# Patient Record
Sex: Female | Born: 1995 | Hispanic: Yes | Marital: Single | Smoking: Never smoker
Health system: Southern US, Community
[De-identification: ages and names within clinical notes are randomized; demographics above are authoritative.]

## PROBLEM LIST (undated history)

## (undated) DIAGNOSIS — G809 Cerebral palsy, unspecified: Secondary | ICD-10-CM

## (undated) DIAGNOSIS — J45909 Unspecified asthma, uncomplicated: Secondary | ICD-10-CM

## (undated) DIAGNOSIS — G43909 Migraine, unspecified, not intractable, without status migrainosus: Secondary | ICD-10-CM

---
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
## 2016-03-27 ENCOUNTER — Ambulatory Visit (HOSPITAL_COMMUNITY)
Admission: EM | Admit: 2016-03-27 | Discharge: 2016-03-27 | Disposition: A | Discharge status: Home / Self Care | Payer: BLUE CROSS/BLUE SHIELD | Attending: Emergency Medicine | Admitting: Emergency Medicine

## 2016-03-27 ENCOUNTER — Encounter (HOSPITAL_COMMUNITY): Payer: Self-pay | Admitting: Emergency Medicine

## 2016-03-27 DIAGNOSIS — G44229 Chronic tension-type headache, not intractable: Secondary | ICD-10-CM

## 2016-03-27 HISTORY — DX: Unspecified asthma, uncomplicated: J45.909

## 2016-03-27 HISTORY — DX: Cerebral palsy, unspecified: G80.9

## 2016-03-27 MED ORDER — INDOMETHACIN 50 MG PO CAPS: 50.0000 mg | ORAL_CAPSULE | Freq: Two times a day (BID) | ORAL | 0 refills | Status: DC

## 2016-03-27 NOTE — Discharge Instructions (Signed)
In addition to the prescribed medicine called indomethacin you may also take acetaminophen every 4 hours and when you are able Benadryl 50 mg along with the Tylenol. The combination of these medicines may be more substantive for headache relief. If you develop additional symptoms such as we discussed or you are questioned about during the exam recommend seeing your PCP promptly or ointment to the emergency department. Read the attached instructions on different types of headache. He may gain some insight on cause and effect of medicine.

## 2016-03-27 NOTE — ED Provider Notes (Signed)
CSN: [id number]     Arrival date & time 03/27/16  1454 History   First MD Initiated Contact with Patient 03/27/16 1537     Chief Complaint  Patient presents with  . Headache    x9 days   (Consider location/radiation/quality/duration/timing/severity/associated sxs/prior Treatment) 21 year old obese female complaining of a headache for 9 days. It is left hemicranial. She states she has had headaches on a regular basis for several years generally every other day and often associated with photophobia. She states she was born with elements of cerebral palsy but as an adult she denies residual effects. She states this headache for 9 days is different in that it is consecutive and a little more intense than her previous headaches. Although she has a long history of frequent headaches she has not seen a physician in a long time. She generally takes ibuprofen for her headaches.  Denies problems with vision, speech, hearing, swallowing, focal paresthesias or weakness, problems with concentration, orientation, memory or mood, nausea, vomiting, abdominal pain. She does complain of photophobia when looking at a light.  Denies any sort of trauma, fall or other injury.       Past Medical History:  Diagnosis Date  . Asthma   . CP (cerebral palsy) (HCC)    History reviewed. No pertinent surgical history. History reviewed. No pertinent family history. Social History  Substance Use Topics  . Smoking status: Never Smoker  . Smokeless tobacco: Never Used  . Alcohol use No   OB History    No data available     Review of Systems  Constitutional: Negative for chills, diaphoresis and fever.  HENT: Negative for ear pain, hearing loss, nosebleeds, sore throat and tinnitus.   Eyes: Positive for photophobia. Negative for discharge and visual disturbance.       See also HPI  Respiratory: Negative for cough and shortness of breath.   Cardiovascular: Negative for chest pain, palpitations and leg swelling.   Gastrointestinal: Negative for abdominal pain and blood in stool.  Genitourinary: Negative.   Musculoskeletal: Negative.   Skin: Negative for rash.  Neurological: Positive for headaches. Negative for dizziness, tremors, seizures, syncope, facial asymmetry, speech difficulty, weakness and numbness.       See also HPI  Hematological: Negative.   Psychiatric/Behavioral: Negative.   All other systems reviewed and are negative.   Allergies  Patient has no known allergies.  Home Medications   Prior to Admission medications   Medication Sig Start Date End Date Taking? Authorizing Provider  indomethacin (INDOCIN) 50 MG capsule Take 1 capsule (50 mg total) by mouth 2 (two) times daily with a meal. Prn headache 03/27/16   Hayden Rasmussen, NP  topiramate (TOPAMAX) 100 MG tablet Take 100 mg by mouth 2 (two) times daily.    Historical Provider, MD   Meds Ordered and Administered this Visit  Medications - No data to display  BP 113/55 (BP Location: Right Arm)   Pulse 72   Temp 98 F (36.7 C) (Oral)   LMP 03/14/2016 (Exact Date)   SpO2 98%  No data found.   Physical Exam  Constitutional: She is oriented to person, place, and time. She appears well-developed and well-nourished. No distress.  HENT:  Head: Normocephalic and atraumatic.  Right Ear: External ear normal.  Left Ear: External ear normal.  Mouth/Throat: Oropharynx is clear and moist. No oropharyngeal exudate.  Positive for tenderness to the left hemicranial scalp. Mild tenderness to the associated posterior neck musculature and trapezii.  Eyes: Conjunctivae and  EOM are normal. Pupils are equal, round, and reactive to light. Right eye exhibits no discharge. Left eye exhibits no discharge.  Neck: Normal range of motion. Neck supple.  Cardiovascular: Normal rate, regular rhythm, normal heart sounds and intact distal pulses.   Pulmonary/Chest: Effort normal and breath sounds normal. No respiratory distress.  Abdominal: There is no  tenderness.  Musculoskeletal: Normal range of motion. She exhibits no edema or tenderness.  Lymphadenopathy:    She has no cervical adenopathy.  Neurological: She is alert and oriented to person, place, and time. She has normal strength. She is not disoriented. She displays no tremor. No cranial nerve deficit or sensory deficit. She exhibits normal muscle tone. She displays a negative Romberg sign. She displays no seizure activity. Coordination and gait normal. GCS eye subscore is 4. GCS verbal subscore is 5. GCS motor subscore is 6.  Reflex Scores:      Patellar reflexes are 2+ on the right side and 2+ on the left side. Finger to nose normal Heel to toe gait normal  Skin: Skin is warm and dry. No rash noted.  Psychiatric: She has a normal mood and affect. Her behavior is normal. Thought content normal.  Nursing note and vitals reviewed.   Urgent Care Course   Clinical Course     Procedures (including critical care time)  Labs Review Labs Reviewed - No data to display  Imaging Review No results found.   Visual Acuity Review  Right Eye Distance:   Left Eye Distance:   Bilateral Distance:    Right Eye Near:   Left Eye Near:    Bilateral Near:         MDM   1. Chronic tension-type headache, not intractable   Tension headache is only a possibility particularly associated with scalp and muscle tenderness. Analgesic rebound headache also may be contributory. Needs to follow-up with PCP or referred to neurology. Patient understands this and states she will comply. In addition to the prescribed medicine called indomethacin you may also take acetaminophen every 4 hours and when you are able Benadryl 50 mg along with the Tylenol. The combination of these medicines may be more substantive for headache relief. If you develop additional symptoms such as we discussed or you are questioned about during the exam recommend seeing your PCP promptly or ointment to the emergency  department. Read the attached instructions on different types of headache. He may gain some insight on cause and effect of medicine. Meds ordered this encounter  Medications  . indomethacin (INDOCIN) 50 MG capsule    Sig: Take 1 capsule (50 mg total) by mouth 2 (two) times daily with a meal. Prn headache    Dispense:  20 capsule    Refill:  0    Order Specific Question:   Supervising Provider    Answer:   Domenick GongMORTENSON, ASHLEY [[id number]]        Hayden Rasmussenavid Sister Carbone, NP 03/27/16 1603

## 2016-03-27 NOTE — ED Triage Notes (Signed)
Pt has been suffering from a headache for 9 days.  She states the throbbing gets worse every day. She has tried taking 800 mg Ibuprofen with little relief.  She has always suffered from headaches, but the last nine days has just been getting worse.

## 2016-09-29 ENCOUNTER — Ambulatory Visit (HOSPITAL_COMMUNITY)
Admission: EM | Admit: 2016-09-29 | Discharge: 2016-09-29 | Disposition: A | Discharge status: Home / Self Care | Payer: BLUE CROSS/BLUE SHIELD | Attending: Internal Medicine | Admitting: Internal Medicine

## 2016-09-29 ENCOUNTER — Encounter (HOSPITAL_COMMUNITY): Payer: Self-pay | Admitting: Emergency Medicine

## 2016-09-29 DIAGNOSIS — G43919 Migraine, unspecified, intractable, without status migrainosus: Secondary | ICD-10-CM | POA: Diagnosis not present

## 2016-09-29 MED ORDER — NAPROXEN 500 MG PO TABS
500.0000 mg | ORAL_TABLET | Freq: Two times a day (BID) | ORAL | 0 refills | Status: DC
Start: 2016-09-29 — End: 2017-04-22

## 2016-09-29 NOTE — ED Triage Notes (Signed)
Headache onset 2 weeks ago.  Reports pain everyday for 2 weeks.  Pain in left side of head, states this is terrible head pain.  Denies cough, cold or runny nose

## 2016-09-29 NOTE — ED Provider Notes (Signed)
CSN: [id number]     Arrival date & time 09/29/16  1542 History   None    Chief Complaint  Patient presents with  . Headache   (Consider location/radiation/quality/duration/timing/severity/associated sxs/prior Treatment) 21 year old female with history of cerebral palsy and chronic headache comes in with 2 week history of headache. She states she usually gets headaches with her cycle, last cycle 2 weeks ago when onset of headache. Cycle has now ended, but headache continues to persist.She has been taking ibuprofen with some relief. But has had worsening of symptoms the past few days. She describes the headache as pounding on the left side of her head, with photophobia. Denies phonophobia, nausea, vomiting. Denies some weakness, syncope, lightheadedness.  She has Topamax at home but has not tried it. Has not been evaluated for migraines in the past. Denies URI symptoms such as fever, congestion, cough.      Past Medical History:  Diagnosis Date  . Asthma   . CP (cerebral palsy) (HCC)    History reviewed. No pertinent surgical history. No family history on file. Social History  Substance Use Topics  . Smoking status: Never Smoker  . Smokeless tobacco: Never Used  . Alcohol use No   OB History    No data available     Review of Systems  Constitutional: Negative for chills, fatigue and fever.  HENT: Negative for congestion, facial swelling, sinus pain, sinus pressure and sore throat.   Respiratory: Negative for shortness of breath and wheezing.   Neurological: Positive for headaches. Negative for dizziness, syncope, weakness, light-headedness and numbness.    Allergies  Patient has no known allergies.  Home Medications   Prior to Admission medications   Medication Sig Start Date End Date Taking? Authorizing Provider  indomethacin (INDOCIN) 50 MG capsule Take 1 capsule (50 mg total) by mouth 2 (two) times daily with a meal. Prn headache 03/27/16   Hayden RasmussenMabe, David, NP  naproxen  (NAPROSYN) 500 MG tablet Take 1 tablet (500 mg total) by mouth 2 (two) times daily. 09/29/16   Cathie HoopsYu, Amy V, PA-C  topiramate (TOPAMAX) 100 MG tablet Take 100 mg by mouth 2 (two) times daily.    [provider]   Meds Ordered and Administered this Visit  Medications - No data to display  BP (!) 108/41 (BP Location: Right Arm) Comment (BP Location): large cuff  Pulse 78   Temp 98.5 F (36.9 C) (Oral)   Resp 18   LMP 09/17/2016   SpO2 100%  No data found.   Physical Exam  Constitutional: She is oriented to person, place, and time. She appears well-developed and well-nourished. No distress.  HENT:  Head: Normocephalic and atraumatic.  Eyes: Conjunctivae are normal. Pupils are equal, round, and reactive to light.  Neck: Normal range of motion.  Cardiovascular: Normal rate, regular rhythm and normal heart sounds.  Exam reveals no gallop and no friction rub.   No murmur heard. Pulmonary/Chest: Effort normal and breath sounds normal. She has no wheezes. She has no rales.  Neurological: She is alert and oriented to person, place, and time. She has normal strength. No cranial nerve deficit or sensory deficit. She displays a negative Romberg sign.  Skin: Skin is warm and dry.  Psychiatric: She has a normal mood and affect. Her behavior is normal. Judgment normal.    Urgent Care Course     Procedures (including critical care time)  Labs Review Labs Reviewed - No data to display  Imaging Review No results found.  MDM   1. Intractable migraine without status migrainosus, unspecified migraine type    Discussed with patient history and exam consistent with headache with migrainous features. Patient with normal neuro exam today. The patient declines IM Toradol and steroids. Start Naproxen 500 mg twice a day for headache. Patient's blood pressure on the lower side today. Denies orthostasis, weakness, dizziness. Patient to push fluids. Patient to follow up with PCP for migraine  workup and possible maintenance medicine. To monitor for nausea, vomiting, worsening of migraine, weakness to the ED for further evaluation.    Belinda Fisher, PA-C 09/29/16 1626

## 2016-09-29 NOTE — Discharge Instructions (Signed)
Start Naproxen 500mg  twice a day for headache. Your headache is consistent with a migraine, follow up with PCP for evaluation of migraine and possible maintenance medicines. Your blood pressure is a little low today, which could be contributing to your headache, keep hydrated, your urine should be a clear/pale yellow color. Monitor for nausea, vomiting, weakness, to go to the ED immediately.

## 2016-11-15 ENCOUNTER — Ambulatory Visit (HOSPITAL_COMMUNITY)
Admission: EM | Admit: 2016-11-15 | Discharge: 2016-11-15 | Disposition: A | Discharge status: Home / Self Care | Payer: BLUE CROSS/BLUE SHIELD | Attending: Family Medicine | Admitting: Family Medicine

## 2016-11-15 ENCOUNTER — Encounter (HOSPITAL_COMMUNITY): Payer: Self-pay | Admitting: Emergency Medicine

## 2016-11-15 DIAGNOSIS — H1032 Unspecified acute conjunctivitis, left eye: Secondary | ICD-10-CM

## 2016-11-15 MED ORDER — TOBRAMYCIN 0.3 % OP SOLN: 1.0000 [drp] | Freq: Four times a day (QID) | OPHTHALMIC | 0 refills | Status: DC

## 2016-11-15 NOTE — ED Provider Notes (Signed)
  [facility]   [id number] 11/15/16 Arrival Time: 1318  ASSESSMENT & PLAN:  1. Acute conjunctivitis of left eye, unspecified acute conjunctivitis type     Meds ordered this encounter  Medications  . tobramycin (TOBREX) 0.3 % ophthalmic solution    Sig: Place 1 drop into the right eye every 6 (six) hours.    Dispense:  5 mL    Refill:  0   Will f/u if not showing improvement within the next several days. Reviewed expectations re: course of current medical issues. Questions answered. Outlined signs and symptoms indicating need for more acute intervention. Patient verbalized understanding. After Visit Summary given.   SUBJECTIVE:  Tammy Randall is a 21 y.o. female who presents with complaint of "pinkeye" of L eye. Noticed yesterday. Gritty feeling in eye. Draining more today. No specific eye pain. No visual changes. No self treatment. Close contact with the same last week. No recent illnesses. Does not wear contact lenses.  ROS: As per HPI.   OBJECTIVE:  Vitals:   11/15/16 1426  BP: (!) 124/55  Pulse: 68  Resp: 20  Temp: 98.5 F (36.9 C)  TempSrc: Oral  SpO2: 100%    General appearance: alert; no distress Eyes: PERRLA; EOMI; conjunctiva normal HENT: normocephalic; atraumatic; TMs normal; nasal mucosa normal; oral mucosa normal Neck: supple Skin: warm and dry Psychological: alert and cooperative; normal mood and affect  Past Medical History:  Diagnosis Date  . Asthma   . CP (cerebral palsy) (HCC)     No Known Allergies       Mardella Layman, MD 11/15/16 1447

## 2016-11-15 NOTE — ED Triage Notes (Signed)
Pt here for left pink eye onset yest associated w/pain, redness, watery  Denies fevers, chills, inj/trauma  A&O x4... NAD... Ambulatory

## 2017-04-22 ENCOUNTER — Encounter (HOSPITAL_COMMUNITY): Payer: Self-pay | Admitting: Emergency Medicine

## 2017-04-22 ENCOUNTER — Ambulatory Visit (HOSPITAL_COMMUNITY)
Admission: EM | Admit: 2017-04-22 | Discharge: 2017-04-22 | Disposition: A | Discharge status: Home / Self Care | Payer: BLUE CROSS/BLUE SHIELD | Attending: Family Medicine | Admitting: Family Medicine

## 2017-04-22 ENCOUNTER — Other Ambulatory Visit: Payer: Self-pay

## 2017-04-22 DIAGNOSIS — Z87898 Personal history of other specified conditions: Secondary | ICD-10-CM | POA: Insufficient documentation

## 2017-04-22 DIAGNOSIS — J45909 Unspecified asthma, uncomplicated: Secondary | ICD-10-CM | POA: Diagnosis not present

## 2017-04-22 DIAGNOSIS — G809 Cerebral palsy, unspecified: Secondary | ICD-10-CM | POA: Insufficient documentation

## 2017-04-22 DIAGNOSIS — R590 Localized enlarged lymph nodes: Secondary | ICD-10-CM | POA: Diagnosis present

## 2017-04-22 DIAGNOSIS — Z8619 Personal history of other infectious and parasitic diseases: Secondary | ICD-10-CM

## 2017-04-22 LAB — POCT PREGNANCY, URINE: Preg Test, Ur: NEGATIVE

## 2017-04-22 MED ORDER — DOXYCYCLINE HYCLATE 100 MG PO TABS: 100.0000 mg | ORAL_TABLET | Freq: Two times a day (BID) | ORAL | 0 refills | Status: DC

## 2017-04-22 NOTE — ED Provider Notes (Signed)
[facility]   [id number] 04/22/17 Arrival Time: 1052   SUBJECTIVE:  Tammy Randall is a 22 y.o. female who presents to the urgent care with complaint of 2 days ago with sore neck, on left side of neck.  Patient has a large firm area on left neck.  No increase in size since noticing this knot, but area is more firm.  Denies cough, cold runny nose.  No injury  She was treated for gonorrhea last month.  She said she was tested for everything. Past Medical History:  Diagnosis Date  . Asthma   . CP (cerebral palsy) (HCC)    Family History  Problem Relation Age of Onset  . Diabetes Mother    Social History   Socioeconomic History  . Marital status: Unknown    Spouse name: Not on file  . Number of children: Not on file  . Years of education: Not on file  . Highest education level: Not on file  Social Needs  . Financial resource strain: Not on file  . Food insecurity - worry: Not on file  . Food insecurity - inability: Not on file  . Transportation needs - medical: Not on file  . Transportation needs - non-medical: Not on file  Occupational History  . Not on file  Tobacco Use  . Smoking status: Never Smoker  . Smokeless tobacco: Never Used  Substance and Sexual Activity  . Alcohol use: No  . Drug use: No  . Sexual activity: Yes    Birth control/protection: Condom  Other Topics Concern  . Not on file  Social History Narrative  . Not on file   No outpatient medications have been marked as taking for the 04/22/17 encounter [facility] Encounter).   No Known Allergies    ROS: As per HPI, remainder of ROS negative.   OBJECTIVE:   Vitals:   04/22/17 1201  BP: (!) 108/34  Pulse: 75  Resp: 18  Temp: 98.3 F (36.8 C)  TempSrc: Oral  SpO2: 100%     General appearance: alert; no distress Eyes: PERRL; EOMI; conjunctiva normal HENT: normocephalic; atraumatic; TMs normal, canal normal, external ears normal without trauma; nasal mucosa normal; oral mucosa  normal Neck: supple; mildly tender 2 cm elliptical subcutaneous mass left lower posterior neck Lungs: clear to auscultation bilaterally Heart: regular rate and rhythm Abdomen: soft, non-tender; bowel sounds normal; no masses or organomegaly; no guarding or rebound tenderness Back: no CVA tenderness Extremities: no cyanosis or edema; symmetrical with no gross deformities Skin: warm and dry Neurologic: normal gait; grossly normal Psychological: alert and cooperative; normal mood and affect      Labs:  Results for orders placed or performed during the hospital encounter of 04/22/17  Pregnancy, urine POC  Result Value Ref Range   Preg Test, Ur NEGATIVE NEGATIVE    Labs Reviewed  POCT PREGNANCY, URINE  URINE CYTOLOGY ANCILLARY ONLY    No results found.     ASSESSMENT & PLAN:  1. Lymphadenopathy, cervical   2. H/O gonorrhea   doxycycline 100 bid x 7 days  Usually, enlarged glands are related to an infection either inside or in the vicinity of the gland.    It often takes a couple weeks for the gland to return to normal.  If the swelling persists, please return.  The final result on the gonorrhea test will be ready in a day or so. Reviewed expectations re: course of current medical issues. Questions answered. Outlined signs and symptoms indicating need for  more acute intervention. Patient verbalized understanding. After Visit Summary given.    Procedures:      Elvina SidleLauenstein, Xavius Spadafore, MD 04/22/17 1302

## 2017-04-22 NOTE — Discharge Instructions (Signed)
Usually, enlarged glands are related to an infection either inside or in the vicinity of the gland.    It often takes a couple weeks for the gland to return to normal.  If the swelling persists, please return.  The final result on the gonorrhea test will be ready in a day or so.

## 2017-04-22 NOTE — ED Triage Notes (Signed)
Woke 2 days ago with sore neck, on left side of neck.  Patient has a large firm area to left neck.  No increase in size since noticing this knot, but area is more firm.  Denies cough, cold runny nose.  No injury

## 2017-04-23 LAB — URINE CYTOLOGY ANCILLARY ONLY
Chlamydia: NEGATIVE
Neisseria Gonorrhea: NEGATIVE

## 2017-07-07 ENCOUNTER — Ambulatory Visit (HOSPITAL_COMMUNITY)
Admission: EM | Admit: 2017-07-07 | Discharge: 2017-07-07 | Disposition: A | Discharge status: Home / Self Care | Payer: BLUE CROSS/BLUE SHIELD | Attending: Family Medicine | Admitting: Family Medicine

## 2017-07-07 ENCOUNTER — Encounter (HOSPITAL_COMMUNITY): Payer: Self-pay | Admitting: Emergency Medicine

## 2017-07-07 DIAGNOSIS — G43711 Chronic migraine without aura, intractable, with status migrainosus: Secondary | ICD-10-CM

## 2017-07-07 DIAGNOSIS — R51 Headache: Secondary | ICD-10-CM

## 2017-07-07 DIAGNOSIS — R519 Headache, unspecified: Secondary | ICD-10-CM

## 2017-07-07 NOTE — Discharge Instructions (Addendum)
May trial oral medication from PCP for headache (uncertain of name). [facility] to schedule appointment for further evaluation of chronic headaches. If headache persists, may return within 48 hours for Toradol injection. Recommend follow-up with headache specialist or Neurologist as planned.

## 2017-07-07 NOTE — ED Triage Notes (Signed)
Pt hx of chronic headaches since childhood, pt requesting a neuro follow up info, pt c/o headache this time since Monday.

## 2017-07-08 NOTE — ED Provider Notes (Signed)
[facility]    CSN: [id number] Arrival date & time: 07/07/17  1251     History   Chief Complaint Chief Complaint  Patient presents with  . Headache    HPI Tammy Randall is a 22 y.o. female.   22 year old female presents with chronic migraine-type headaches. She has experienced these headaches since childhood. 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 She usually has a headache on the left side that is accompanied by photophobia and some dizziness. Usually is not nauseous or vomit but sleep usually relieves the headache. She has tried various OTC medications and Rx NSAIDs in the past with varying success. This current headache has been more severe for the past 2 days and located on the left frontal area with radiation to the parietal and occipital areas. Denies any fever, blurred vision, numbness, syncope or other neurological deficits. No distinct aura. Did not take any medication yet- wanted to see how long headache would last without treatment. She has been to her PCP in the past for management but would like to see a Neurologist or Headache Specialist for further management. No family history of headache disorder. No other chronic health issues except asthma. Takes no daily medication.   The history is provided by the patient.  Headache  Pain location:  L parietal, L temporal and frontal Quality:  Dull and stabbing Radiates to:  Eyes and L neck Severity currently:  6/10 Severity at highest:  9/10 Onset quality:  Gradual Duration:  2 days Timing:  Intermittent Progression:  Unchanged Chronicity:  Chronic Similar to prior headaches: yes   Context: bright light and emotional stress   Context: not caffeine, not coughing, not eating, not exposure to cold air, not loud noise and not straining   Relieved by:  Resting in a darkened room Worsened  by:  Activity and light Ineffective treatments:  NSAIDs, acetaminophen and prescription medications Associated symptoms: dizziness, eye pain (headache radiates to her eyes), fatigue and photophobia   Associated symptoms: no abdominal pain, no back pain, no blurred vision, no congestion, no cough, no diarrhea, no drainage, no ear pain, no facial pain, no fever, no focal weakness, no hearing loss, no loss of balance, no myalgias, no nausea, no near-syncope, no neck pain, no neck stiffness, no numbness, no paresthesias, no seizures, no sinus pressure, no sore throat, no swollen glands, no syncope, no tingling, no URI, no visual change, no vomiting and no weakness     Past Medical History:  Diagnosis Date  . Asthma   . CP (cerebral palsy) (HCC)     There are no active problems to display for this patient.   History reviewed. No pertinent surgical history.  OB History   None      Home Medications    Prior to Admission medications   Not on File    Family History Family History  Problem Relation Age of Onset  . Diabetes Mother     Social History Social History   Tobacco Use  . Smoking status: Never Smoker  . Smokeless tobacco: Never Used  Substance Use Topics  . Alcohol use: No  . Drug use: No     Allergies   Patient has no known allergies.   Review of Systems Review of Systems  Constitutional: Positive for activity change and fatigue. Negative for appetite change, chills  and fever.  HENT: Negative for congestion, ear discharge, ear pain, facial swelling, hearing loss, postnasal drip, rhinorrhea, sinus pressure, sneezing and sore throat.   Eyes: Positive for photophobia and pain (headache radiates to her eyes). Negative for blurred vision, discharge, redness and visual disturbance.  Respiratory: Negative for cough, chest tightness, shortness of breath and wheezing.   Cardiovascular: Negative for chest pain, palpitations, syncope and near-syncope.  Gastrointestinal:  Negative for abdominal pain, diarrhea, nausea and vomiting.  Musculoskeletal: Negative for arthralgias, back pain, myalgias, neck pain and neck stiffness.  Skin: Negative for rash and wound.  Allergic/Immunologic: Negative for environmental allergies and immunocompromised state.  Neurological: Positive for dizziness, light-headedness and headaches. Negative for tremors, focal weakness, seizures, syncope, facial asymmetry, weakness, numbness, paresthesias and loss of balance.  Hematological: Negative for adenopathy. Does not bruise/bleed easily.     Physical Exam Triage Vital Signs ED Triage Vitals  Enc Vitals Group     BP 07/07/17 1336 (!) 127/93     Pulse Rate 07/07/17 1336 65     Resp 07/07/17 1336 16     Temp 07/07/17 1336 98.2 F (36.8 C)     Temp src --      SpO2 07/07/17 1336 98 %     Weight --      Height --      Head Circumference --      Peak Flow --      Pain Score 07/07/17 1337 6     Pain Loc --      Pain Edu? --      Excl. in GC? --    No data found.  Updated Vital Signs BP (!) 127/93   Pulse 65   Temp 98.2 F (36.8 C)   Resp 16   LMP 06/22/2017   SpO2 98%   Visual Acuity Right Eye Distance:   Left Eye Distance:   Bilateral Distance:    Right Eye Near:   Left Eye Near:    Bilateral Near:     Physical Exam  Constitutional: She is oriented to person, place, and time. She appears well-developed and well-nourished. She is cooperative. She does not appear ill. No distress.  Patient sitting on exam table in no acute distress.   HENT:  Head: Normocephalic and atraumatic.    Right Ear: Hearing, tympanic membrane, external ear and ear canal normal.  Left Ear: Hearing, tympanic membrane, external ear and ear canal normal.  Nose: Right sinus exhibits no maxillary sinus tenderness and no frontal sinus tenderness. Left sinus exhibits frontal sinus tenderness. Left sinus exhibits no maxillary sinus tenderness.  Mouth/Throat: Uvula is midline, oropharynx is  clear and moist and mucous membranes are normal.  Headache starts around left frontal area and radiates to left parietal, occipital and behind left eye.   Eyes: Pupils are equal, round, and reactive to light. Conjunctivae, EOM and lids are normal.  Neck: Trachea normal, normal range of motion and full passive range of motion without pain. Neck supple. No JVD present. No spinous process tenderness and no muscular tenderness present. Carotid bruit is not present. No neck rigidity. Normal range of motion present.  Cardiovascular: Normal rate, regular rhythm and normal heart sounds.  No murmur heard. Pulmonary/Chest: Effort normal and breath sounds normal. No stridor. No respiratory distress. She has no wheezes.  Musculoskeletal: Normal range of motion.  Neurological: She is alert and oriented to person, place, and time. She has normal strength and normal reflexes. No cranial nerve deficit or sensory deficit.  She displays a negative Romberg sign. Coordination and gait normal. GCS eye subscore is 4. GCS verbal subscore is 5. GCS motor subscore is 6.  Skin: Skin is warm and dry. Capillary refill takes less than 2 seconds. No rash noted.  Psychiatric: She has a normal mood and affect. Her behavior is normal. Judgment and thought content normal.  Vitals reviewed.    UC Treatments / Results  Labs (all labs ordered are listed, but only abnormal results are displayed) Labs Reviewed - No data to display  EKG None Radiology No results found.  Procedures Procedures (including critical care time)  Medications Ordered in UC Medications - No data to display   Initial Impression / Assessment and Plan / UC Course  I have reviewed the triage vital signs and the nursing notes.  Pertinent labs & imaging results that were available during my care of the patient were reviewed by me and considered in my medical decision making (see chart for details).    Discussed with patient that she probably has  chronic migraines with some tension-type headache intermixed. Reviewed that she most likely should be on preventative medication since she has multiple headaches each month in varying length. Also briefly discussed multiple options for treatment- may be candidate for Triptans. Offered Toradol IM today but patient declined. Also offered Rx NSAIDs but patient also declined. Recommend take Aleve 2 tablets every 12 hours as needed for pain. Patient has oral powder mix that was prescribed by her PCP for headaches (uncertain of name)- may also trial this medication. [facility] to schedule appointment for further evaluation. [facility], may also contact [facility]. Note written for work for today. Patient indicates that if headache has not improved within 48 hours, she will return for Toradol injection. Otherwise, follow-up with Headache Specialist or Neurologist as planned.   Final Clinical Impressions(s) / UC Diagnoses   Final diagnoses:  Chronic migraine without aura, intractable, with status migrainosus  Headache disorder    ED Discharge Orders    None       Controlled Substance Prescriptions [place] Controlled Substance Registry consulted? Not Applicable   Sudie Grumblingmyot, Aoife Bold Berry, NP 07/08/17 [phone]

## 2017-10-04 ENCOUNTER — Encounter (HOSPITAL_COMMUNITY): Payer: Self-pay | Admitting: Emergency Medicine

## 2017-10-04 ENCOUNTER — Emergency Department (HOSPITAL_COMMUNITY)
Admission: EM | Admit: 2017-10-04 | Discharge: 2017-10-05 | Disposition: A | Discharge status: Home / Self Care | Payer: BLUE CROSS/BLUE SHIELD | Attending: Emergency Medicine | Admitting: Emergency Medicine

## 2017-10-04 ENCOUNTER — Other Ambulatory Visit: Payer: Self-pay

## 2017-10-04 DIAGNOSIS — J45909 Unspecified asthma, uncomplicated: Secondary | ICD-10-CM | POA: Diagnosis not present

## 2017-10-04 DIAGNOSIS — R51 Headache: Secondary | ICD-10-CM | POA: Insufficient documentation

## 2017-10-04 DIAGNOSIS — Z79899 Other long term (current) drug therapy: Secondary | ICD-10-CM | POA: Insufficient documentation

## 2017-10-04 DIAGNOSIS — R519 Headache, unspecified: Secondary | ICD-10-CM

## 2017-10-04 LAB — CBC
HCT: 41 % (ref 36.0–46.0)
Hemoglobin: 13 g/dL (ref 12.0–15.0)
MCH: 27.5 pg (ref 26.0–34.0)
MCHC: 31.7 g/dL (ref 30.0–36.0)
MCV: 86.7 fL (ref 78.0–100.0)
Platelets: 156 10*3/uL (ref 150–400)
RBC: 4.73 MIL/uL (ref 3.87–5.11)
RDW: 13.2 % (ref 11.5–15.5)
WBC: 7.4 10*3/uL (ref 4.0–10.5)

## 2017-10-04 LAB — I-STAT BETA HCG BLOOD, ED (MC, WL, AP ONLY)

## 2017-10-04 LAB — BASIC METABOLIC PANEL
Anion gap: 8 (ref 5–15)
BUN: 5 mg/dL — AB (ref 6–20)
CALCIUM: 8.2 mg/dL — AB (ref 8.9–10.3)
CHLORIDE: 105 mmol/L (ref 98–111)
CO2: 24 mmol/L (ref 22–32)
CREATININE: 0.69 mg/dL (ref 0.44–1.00)
GFR calc Af Amer: >60 mL/min (ref >60)
GFR calc non Af Amer: >60 mL/min (ref >60)
Glucose, Bld: 92 mg/dL (ref 70–99)
Potassium: 3.3 mmol/L — ABNORMAL LOW (ref 3.5–5.1)
SODIUM: 137 mmol/L (ref 135–145)

## 2017-10-04 NOTE — ED Triage Notes (Signed)
Pt reports migraines "since birth." States current migraine has been bothering her since last week. Reports vision changes and decreased appetite.

## 2017-10-05 MED ORDER — METOCLOPRAMIDE HCL 5 MG/ML IJ SOLN
10.0000 mg | Freq: Once | INTRAMUSCULAR | Status: AC
  Administered 2017-10-05: 10 mg via INTRAVENOUS
  Filled 2017-10-05: qty 2

## 2017-10-05 MED ORDER — DIPHENHYDRAMINE HCL 50 MG/ML IJ SOLN
25.0000 mg | Freq: Once | INTRAMUSCULAR | Status: AC
  Administered 2017-10-05: 25 mg via INTRAVENOUS
  Filled 2017-10-05: qty 1

## 2017-10-05 MED ORDER — SODIUM CHLORIDE 0.9 % IV BOLUS
1000.0000 mL | Freq: Once | INTRAVENOUS | Status: AC
  Administered 2017-10-05: 1000 mL via INTRAVENOUS

## 2017-10-05 MED ORDER — KETOROLAC TROMETHAMINE 30 MG/ML IJ SOLN
30.0000 mg | Freq: Once | INTRAMUSCULAR | Status: AC
  Administered 2017-10-05: 30 mg via INTRAVENOUS
  Filled 2017-10-05: qty 1

## 2017-10-05 NOTE — ED Provider Notes (Signed)
[facility] EMERGENCY DEPARTMENT Provider Note   CSN: [id number] Arrival date & time: 10/04/17  2139     History   Chief Complaint Chief Complaint  Patient presents with  . Migraine    HPI Tammy Randall is a 22 y.o. female.  Patient presents to the emergency department with a chief complaint of migraine.  She reports a history of migraines.  She states that she has had them since she was a child.  She reports having had this migraine for the past week.  She has recently moved to the area, and does not have a neurologist.  Her last neurologist was in [place].  She denies numbness, weakness, or tingling.  Denies vision changes.  Denies any fever, chills, or neck stiffness.  Denies any recent illnesses.  She states that she has tried taking baclofen with no relief.  Additionally, patient asks if she can have allergy testing done in the emergency department.  The history is provided by the patient. No language interpreter was used.    Past Medical History:  Diagnosis Date  . Asthma   . CP (cerebral palsy) (HCC)     There are no active problems to display for this patient.   History reviewed. No pertinent surgical history.   OB History   None      Home Medications    Prior to Admission medications   Medication Sig Start Date End Date Taking? Authorizing Provider  acetaminophen (TYLENOL) 500 MG tablet Take 1,000 mg by mouth every 6 (six) hours as needed for headache.   Yes [provider]    Family History Family History  Problem Relation Age of Onset  . Diabetes Mother     Social History Social History   Tobacco Use  . Smoking status: Never Smoker  . Smokeless tobacco: Never Used  Substance Use Topics  . Alcohol use: No  . Drug use: No     Allergies   Patient has no known allergies.   Review of Systems Review of Systems  All other systems reviewed and are negative.    Physical Exam Updated Vital Signs BP 119/63  (BP Location: Right Arm)   Pulse (!) 112   Temp 99.4 F (37.4 C) (Oral)   Resp 16   Ht 5\' 2"  (1.575 m)   Wt 108.9 kg (240 lb)   LMP 08/22/2017 (Approximate)   SpO2 99%   BMI 43.90 kg/m   Physical Exam  Constitutional: She is oriented to person, place, and time. She appears well-developed and well-nourished.  HENT:  Head: Normocephalic and atraumatic.  Right Ear: External ear normal.  Left Ear: External ear normal.  Eyes: Pupils are equal, round, and reactive to light. Conjunctivae and EOM are normal.  Neck: Normal range of motion. Neck supple.  No pain with neck flexion, no meningismus  Cardiovascular: Normal rate, regular rhythm and normal heart sounds. Exam reveals no gallop and no friction rub.  No murmur heard. Pulmonary/Chest: Effort normal and breath sounds normal. No respiratory distress. She has no wheezes. She has no rales. She exhibits no tenderness.  Abdominal: Soft. She exhibits no distension and no mass. There is no tenderness. There is no rebound and no guarding.  Musculoskeletal: Normal range of motion. She exhibits no edema or tenderness.  Normal gait.  Neurological: She is alert and oriented to person, place, and time. She has normal reflexes.  CN 3-12 intact, normal finger to nose, no pronator drift, sensation and strength intact bilaterally.  Skin: Skin is warm and dry.  Psychiatric: She has a normal mood and affect. Her behavior is normal. Judgment and thought content normal.  Nursing note and vitals reviewed.    ED Treatments / Results  Labs (all labs ordered are listed, but only abnormal results are displayed) Labs Reviewed  BASIC METABOLIC PANEL - Abnormal; Notable for the following components:      Result Value   Potassium 3.3 (*)    BUN 5 (*)    Calcium 8.2 (*)    All other components within normal limits  CBC  I-STAT BETA HCG BLOOD, ED ([facility], [facility], [facility] ONLY)    EKG None  Radiology No results found.  Procedures Procedures (including critical  care time)  Medications Ordered in ED Medications  ketorolac (TORADOL) 30 MG/ML injection 30 mg (has no administration in time range)  metoCLOPramide (REGLAN) injection 10 mg (has no administration in time range)  diphenhydrAMINE (BENADRYL) injection 25 mg (has no administration in time range)  sodium chloride 0.9 % bolus 1,000 mL (has no administration in time range)     Initial Impression / Assessment and Plan / ED Course  I have reviewed the triage vital signs and the nursing notes.  Pertinent labs & imaging results that were available during my care of the patient were reviewed by me and considered in my medical decision making (see chart for details).  Clinical Course as of Oct 06 3  Wynelle LinkSun Oct 04, 2017  2329 Anion gap: 8 [RH]    Clinical Course User Index [RH] Julieanne MansonHaug, Rebecca, Student-PA    Pt HA treated and improved while in ED.  Presentation is like pts typical HA and non concerning for [facility], ICH, Meningitis, or temporal arteritis. Pt is afebrile with no focal neuro deficits, nuchal rigidity, or change in vision. Pt is to follow up with PCP to discuss prophylactic medication. Pt verbalizes understanding and is agreeable with plan to dc.   Of note, on reassessment, the patient is sitting up in bed eating food with the television at nearly full volume.  She reports that she has had improvement of her symptoms.  Regarding the patient's request for allergy testing, I have provided her with a referral.  Final Clinical Impressions(s) / ED Diagnoses   Final diagnoses:  Nonintractable headache, unspecified chronicity pattern, unspecified headache type    ED Discharge Orders    None       Roxy HorsemanBrowning, Ricka Westra, PA-C 10/05/17 0116    Nira Connardama, Pedro Eduardo, MD 10/05/17 1800

## 2017-10-26 ENCOUNTER — Ambulatory Visit (HOSPITAL_COMMUNITY)
Admission: EM | Admit: 2017-10-26 | Discharge: 2017-10-26 | Disposition: A | Discharge status: Home / Self Care | Payer: BLUE CROSS/BLUE SHIELD | Attending: Family Medicine | Admitting: Family Medicine

## 2017-10-26 ENCOUNTER — Ambulatory Visit (INDEPENDENT_AMBULATORY_CARE_PROVIDER_SITE_OTHER): Payer: BLUE CROSS/BLUE SHIELD

## 2017-10-26 ENCOUNTER — Encounter (HOSPITAL_COMMUNITY): Payer: Self-pay | Admitting: Emergency Medicine

## 2017-10-26 DIAGNOSIS — M79645 Pain in left finger(s): Secondary | ICD-10-CM

## 2017-10-26 DIAGNOSIS — S61257A Open bite of left little finger without damage to nail, initial encounter: Secondary | ICD-10-CM | POA: Diagnosis not present

## 2017-10-26 DIAGNOSIS — S61237A Puncture wound without foreign body of left little finger without damage to nail, initial encounter: Secondary | ICD-10-CM | POA: Diagnosis not present

## 2017-10-26 DIAGNOSIS — W540XXA Bitten by dog, initial encounter: Secondary | ICD-10-CM | POA: Diagnosis not present

## 2017-10-26 MED ORDER — AMOXICILLIN-POT CLAVULANATE 875-125 MG PO TABS: 1.0000 | ORAL_TABLET | Freq: Two times a day (BID) | ORAL | 0 refills | Status: DC

## 2017-10-26 MED ORDER — BACITRACIN ZINC 500 UNIT/GM EX OINT
TOPICAL_OINTMENT | CUTANEOUS | Status: AC
  Filled 2017-10-26: qty 28.35

## 2017-10-26 NOTE — ED Provider Notes (Signed)
[facility]    CSN: [id number] Arrival date & time: 10/26/17  1817     History   Chief Complaint Chief Complaint  Patient presents with  . Animal Bite    HPI Tammy MaskerSamantha Degrazia is a 22 y.o. female.   Patient was bitten by her dog, a pitbull on her left fifth finger.  There is no active bleeding and no open laceration.  HPI  Past Medical History:  Diagnosis Date  . Asthma   . CP (cerebral palsy) (HCC)     There are no active problems to display for this patient.   History reviewed. No pertinent surgical history.  OB History   None      Home Medications    Prior to Admission medications   Medication Sig Start Date End Date Taking? Authorizing Provider  acetaminophen (TYLENOL) 500 MG tablet Take 1,000 mg by mouth every 6 (six) hours as needed for headache.    [provider]    Family History Family History  Problem Relation Age of Onset  . Diabetes Mother     Social History Social History   Tobacco Use  . Smoking status: Never Smoker  . Smokeless tobacco: Never Used  Substance Use Topics  . Alcohol use: No  . Drug use: No     Allergies   Patient has no known allergies.   Review of Systems Review of Systems  Constitutional: Negative for chills and fever.  HENT: Negative for ear pain and sore throat.   Eyes: Negative for pain and visual disturbance.  Respiratory: Negative for cough and shortness of breath.   Cardiovascular: Negative for chest pain and palpitations.  Gastrointestinal: Negative for abdominal pain and vomiting.  Genitourinary: Negative for dysuria and hematuria.  Musculoskeletal: Negative for arthralgias and back pain.  Skin: Positive for wound. Negative for color change and rash.       Left fifth finger  Neurological: Negative for seizures and syncope.  All other systems reviewed and are negative.    Physical Exam Triage Vital Signs ED Triage Vitals [10/26/17 1853]  Enc Vitals Group     BP 123/66   Pulse Rate 83     Resp 16     Temp 98.1 F (36.7 C)     Temp Source Oral     SpO2 100 %     Weight      Height      Head Circumference      Peak Flow      Pain Score 5     Pain Loc      Pain Edu?      Excl. in GC?    No data found.  Updated Vital Signs BP 123/66   Pulse 83   Temp 98.1 F (36.7 C) (Oral)   Resp 16   LMP 10/05/2017   SpO2 100%   Visual Acuity Right Eye Distance:   Left Eye Distance:   Bilateral Distance:    Right Eye Near:   Left Eye Near:    Bilateral Near:     Physical Exam  Constitutional: She appears well-nourished.  Musculoskeletal:  Left fifth finger: Patient holds slightly flexed and will not fully extend.  There is some superficial wound and bruising noted.  X-ray shows no evidence of bony injury  UC Treatments / Results  Labs (all labs ordered are listed, but only abnormal results are displayed) Labs Reviewed - No data to display  EKG None  Radiology No results found.  Procedures  Procedures (including critical care time)  Medications Ordered in UC Medications - No data to display  Initial Impression / Assessment and Plan / UC Course  I have reviewed the triage vital signs and the nursing notes.  Pertinent labs & imaging results that were available during my care of the patient were reviewed by me and considered in my medical decision making (see chart for details).     Dog bite left fifth finger.  There is swelling and bruising and soft tissue injury.  There is some puncture on the dorsum (but not flexor) of the finger.  Will cover with Augmentin 850 mg Final Clinical Impressions(s) / UC Diagnoses   Final diagnoses:  None   Discharge Instructions   None    ED Prescriptions    None     Controlled Substance Prescriptions [place] Controlled Substance Registry consulted? No   Frederica Kuster, MD 10/26/17 [phone]

## 2017-10-26 NOTE — ED Triage Notes (Signed)
PT was bit by her dog on left fifth finger one hour ago. PT reports animal is UTD on rabies vaccine

## 2017-12-02 NOTE — Progress Notes (Signed)
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   History supplemented by ED note.  Onset:  Childhood Location:  Left sided, back of head Quality:  Pounding, stabbing/needles Intensity:  Severe.  She denies new headache, thunderclap headache Aura:  no Prodrome:  no Postdrome:  fatigue Associated symptoms:  Photophobia, phonophobia, blurred vision.  Sometimes nausea.  She denies associated unilateral numbness or weakness. Duration:  2 to 3 weeks Frequency:  Typically every 2 to 3 months Frequency of abortive medication: Rarely Triggers:  Emotional stress Exacerbating factors:  Emotional stress Relieving factors:  Nothing Activity:  Aggravates.  Cannot function  Current NSAIDS:  no Current analgesics:  Tylenol. Current triptans:  no Current ergotamine:  no Current anti-emetic:  no Current muscle relaxants:  no Current anti-anxiolytic:  no Current sleep aide:  no Current Antihypertensive medications:  no Current Antidepressant medications:  no Current Anticonvulsant medications:  no Current anti-CGRP:  no Current Vitamins/Herbal/Supplements:  no Current Antihistamines/Decongestants:  no Other therapy:  no Other medication:  none  Past NSAIDS:  Naproxen 500mg , indomethacin 50mg , Cambia Past analgesics:  Tramadol (variable efficacy) Past abortive triptans:  No Past abortive ergotamine:  No Past muscle relaxants:  baclofen Past anti-emetic:  no Past antihypertensive medications:  no Past antidepressant medications:  no Past anticonvulsant medications:  topiramate 100mg  twice daily, zonisamide Past anti-CGRP:  no Past vitamins/Herbal/Supplements:  none Past  antihistamines/decongestants:  none Other past therapies:  none  Caffeine:  no Alcohol:  rarely Smoker:  Marijuana once in awhile Diet:  Water, juice, rarely soda Exercise:  Trying to Depression:  sometimes; Anxiety:  yes Other pain:  Ankle pain Sleep hygiene:  poor Family history of headache:  Mom  10/04/17 BMP: Na 137, K 3.3, Cl 105, CO2 24, glucose 94, BUN 5, Cr 0.69  PAST MEDICAL HISTORY: Past Medical History:  Diagnosis Date  . Asthma   . CP (cerebral palsy) (HCC)     PAST SURGICAL HISTORY: No past surgical history on file.  MEDICATIONS: Current Outpatient Medications on File Prior to Visit  Medication Sig Dispense Refill  . acetaminophen (TYLENOL) 500 MG tablet Take 1,000 mg by mouth every 6 (six) hours as needed for headache.    Marland Kitchen amoxicillin-clavulanate (AUGMENTIN) 875-125 MG tablet Take 1 tablet by mouth 2 (two) times daily. 10 tablet 0   No current facility-administered medications on file prior to visit.     ALLERGIES: No Known Allergies  FAMILY HISTORY: Family History  Problem Relation Age of Onset  . Diabetes Mother    SOCIAL HISTORY: Social History   Socioeconomic History  . Marital status: Unknown    Spouse name: Not on file  . Number of children: Not on file  . Years of education: Not on file  . Highest education level: Not on file  Occupational History  . Not on file  Social Needs  . Financial resource strain: Not on file  . Food insecurity:    Worry: Not on file    Inability: Not on file  . Transportation needs:    Medical: Not on file    Non-medical: Not on file  Tobacco Use  . Smoking status: Never Smoker  . Smokeless tobacco: Never Used  Substance and Sexual Activity  .  Alcohol use: No  . Drug use: No  . Sexual activity: Yes    Birth control/protection: Condom  Lifestyle  . Physical activity:    Days per week: Not on file    Minutes per session: Not on file  . Stress: Not on file  Relationships  . Social connections:     Talks on phone: Not on file    Gets together: Not on file    Attends religious service: Not on file    Active member of club or organization: Not on file    Attends meetings of clubs or organizations: Not on file    Relationship status: Not on file  . Intimate partner violence:    Fear of current or ex partner: Not on file    Emotionally abused: Not on file    Physically abused: Not on file    Forced sexual activity: Not on file  Other Topics Concern  . Not on file  Social History Narrative  . Not on file    REVIEW OF SYSTEMS: Constitutional: No fevers, chills, or sweats, no generalized fatigue, change in appetite Eyes: No visual changes, double vision, eye pain Ear, nose and throat: No hearing loss, ear pain, nasal congestion, sore throat Cardiovascular: No chest pain, palpitations Respiratory:  No shortness of breath at rest or with exertion, wheezes GastrointestinaI: No nausea, vomiting, diarrhea, abdominal pain, fecal incontinence Genitourinary:  No dysuria, urinary retention or frequency Musculoskeletal:  No neck pain, back pain Integumentary: No rash, pruritus, skin lesions Neurological: as above Psychiatric: No depression, insomnia, anxiety Endocrine: No palpitations, fatigue, diaphoresis, mood swings, change in appetite, change in weight, increased thirst Hematologic/Lymphatic:  No purpura, petechiae. Allergic/Immunologic: no itchy/runny eyes, nasal congestion, recent allergic reactions, rashes  PHYSICAL EXAM: Blood pressure 122/60, pulse 78, height 5\' 4"  (1.626 m), weight 246 lb (111.6 kg), SpO2 98 %. General: No acute distress.  Patient appears well-groomed.  Head:  Normocephalic/atraumatic Eyes:  fundi examined but not visualized Neck: supple, no paraspinal tenderness, full range of motion Back: No paraspinal tenderness Heart: regular rate and rhythm Lungs: Clear to auscultation bilaterally. Vascular: No carotid bruits. Neurological Exam: Mental status: alert  and oriented to person, place, and time, recent and remote memory intact, fund of knowledge intact, attention and concentration intact, speech fluent and not dysarthric, language intact. Cranial nerves: CN I: not tested CN II: pupils equal, round and reactive to light, visual fields intact CN III, IV, VI:  full range of motion, no nystagmus, no ptosis CN V: facial sensation intact CN VII: upper and lower face symmetric CN VIII: hearing intact CN IX, X: gag intact, uvula midline CN XI: sternocleidomastoid and trapezius muscles intact CN XII: tongue midline Bulk & Tone: normal, no fasciculations. Motor:  5/5 throughout  Sensation:  temperature and vibration sensation intact.   Deep Tendon Reflexes:  2+ throughout, toes downgoing.   Finger to nose testing:  Without dysmetria.   Heel to shin:  Without dysmetria.   Gait:  Normal station and stride.  Romberg negative  IMPRESSION: Migraine without aura, with status migrainosus, intractable  PLAN: 1.  Start nortriptyline 25mg  at bedtime.  We can increase dose to 50mg  at bedtime in 4 weeks if needed. 2.  For abortive therapy, Sprix nasal sray, 1 spray in each nostril every 6 to 8 hours, maximum of 4 doses in 24 hours 3.  Limit use of pain relievers to no more than 2 days out of week to prevent risk of rebound or medication-overuse headache.  4.  Keep headache diary 5.  Follow up in 3 to 4 months.  Thank you for allowing me to take part in the care of this patient.  Shon Millet, DO

## 2017-12-04 ENCOUNTER — Ambulatory Visit (INDEPENDENT_AMBULATORY_CARE_PROVIDER_SITE_OTHER): Payer: BLUE CROSS/BLUE SHIELD | Admitting: Neurology

## 2017-12-04 ENCOUNTER — Encounter: Payer: Self-pay | Admitting: Neurology

## 2017-12-04 VITALS — BP 122/60 | HR 78 | Ht 64.0 in | Wt 246.0 lb

## 2017-12-04 DIAGNOSIS — G43011 Migraine without aura, intractable, with status migrainosus: Secondary | ICD-10-CM

## 2017-12-04 MED ORDER — NORTRIPTYLINE HCL 25 MG PO CAPS: 25.0000 mg | ORAL_CAPSULE | Freq: Every day | ORAL | 3 refills | Status: DC

## 2017-12-04 NOTE — Patient Instructions (Signed)
Migraine Recommendations: 1.  Start nortriptyline 25mg  at bedtime.  Call in 4 weeks with update and we can adjust dose if needed. 2.  Take Sprix nasal spray at earliest onset of headache.  1 spray in both nostrils every 6 to 8 hours, maximum of 4 doses in 24 hours 3.  Limit use of pain relievers to no more than 2 days out of the week.  These medications include acetaminophen, ibuprofen, triptans and narcotics.  This will help reduce risk of rebound headaches. 4.  Be aware of common food triggers such as processed sweets, processed foods with nitrites (such as deli meat, hot dogs, sausages), foods with MSG, alcohol (such as wine), chocolate, certain cheeses, certain fruits (dried fruits, bananas, some citrus fruit), vinegar, diet soda. 4.  Avoid caffeine 5.  Routine exercise 6.  Proper sleep hygiene 7.  Stay adequately hydrated with water 8.  Keep a headache diary. 9.  Maintain proper stress management. 10.  Do not skip meals. 11.  Consider supplements:  Magnesium citrate 400mg  to 600mg  daily, riboflavin 400mg , Coenzyme Q 10 100mg  three times daily

## 2017-12-04 NOTE — Addendum Note (Signed)
Addended by: Dorthy CoolerBURNS, Kishaun Erekson J on: 12/04/2017 11:38 AM   Modules accepted: Orders

## 2018-04-12 ENCOUNTER — Ambulatory Visit: Payer: BLUE CROSS/BLUE SHIELD | Admitting: Neurology

## 2018-05-14 ENCOUNTER — Emergency Department (HOSPITAL_COMMUNITY)
Admission: EM | Admit: 2018-05-14 | Discharge: 2018-05-15 | Disposition: A | Discharge status: Home / Self Care | Payer: BLUE CROSS/BLUE SHIELD | Attending: Emergency Medicine | Admitting: Emergency Medicine

## 2018-05-14 ENCOUNTER — Encounter (HOSPITAL_COMMUNITY): Payer: Self-pay | Admitting: Emergency Medicine

## 2018-05-14 ENCOUNTER — Other Ambulatory Visit: Payer: Self-pay

## 2018-05-14 DIAGNOSIS — R519 Headache, unspecified: Secondary | ICD-10-CM

## 2018-05-14 DIAGNOSIS — J45909 Unspecified asthma, uncomplicated: Secondary | ICD-10-CM | POA: Insufficient documentation

## 2018-05-14 DIAGNOSIS — R51 Headache: Secondary | ICD-10-CM | POA: Insufficient documentation

## 2018-05-14 DIAGNOSIS — Z79899 Other long term (current) drug therapy: Secondary | ICD-10-CM | POA: Insufficient documentation

## 2018-05-14 DIAGNOSIS — G809 Cerebral palsy, unspecified: Secondary | ICD-10-CM | POA: Insufficient documentation

## 2018-05-14 HISTORY — DX: Migraine, unspecified, not intractable, without status migrainosus: G43.909

## 2018-05-14 NOTE — ED Triage Notes (Signed)
Co migraine x 2 1/2 weeks with light sensitivity.  Reports history of same.  States she has tried Naproxen and "headache spray" that her doctor gave her without relief.

## 2018-05-15 MED ORDER — DIPHENHYDRAMINE HCL 50 MG/ML IJ SOLN
25.0000 mg | Freq: Once | INTRAMUSCULAR | Status: AC
  Administered 2018-05-15: 25 mg via INTRAVENOUS
  Filled 2018-05-15: qty 1

## 2018-05-15 MED ORDER — SODIUM CHLORIDE 0.9 % IV BOLUS
1000.0000 mL | Freq: Once | INTRAVENOUS | Status: AC
  Administered 2018-05-15: 1000 mL via INTRAVENOUS

## 2018-05-15 MED ORDER — METOCLOPRAMIDE HCL 5 MG/ML IJ SOLN
10.0000 mg | Freq: Once | INTRAMUSCULAR | Status: AC
  Administered 2018-05-15: 10 mg via INTRAVENOUS
  Filled 2018-05-15: qty 2

## 2018-05-15 MED ORDER — KETOROLAC TROMETHAMINE 30 MG/ML IJ SOLN
30.0000 mg | Freq: Once | INTRAMUSCULAR | Status: AC
  Administered 2018-05-15: 30 mg via INTRAVENOUS
  Filled 2018-05-15: qty 1

## 2018-05-15 NOTE — ED Provider Notes (Signed)
[facility] EMERGENCY DEPARTMENT Provider Note   CSN: [id number] Arrival date & time: 05/14/18  2101    History   Chief Complaint Chief Complaint  Patient presents with  . Migraine    HPI Tammy Randall is a 23 y.o. female.     The history is provided by the patient and medical records.  Migraine  Associated symptoms include headaches.    23 y.o. F with hx of CP, migraine headaches, asthma, presenting to the ED for headache.  States has been ongoing for about 2 weeks.  Headache seems to be moving around her head, currently localized to left side of head.  Pain has always been throbbing in nature, has not worsening but remains persistent.  Has some lightheadedness and photophobia which is common for her during migraines.  She denies any numbness or weakness, confusion, fever, chills, or neck pain.  States she did try naproxen and some "migraine spray" that was given to her by her neurologist without much relief.  States these medications have never really worked for her.  She is not had any falls or head trauma.  Not currently on anticoagulation.  Past Medical History:  Diagnosis Date  . Asthma   . CP (cerebral palsy) (HCC)   . Migraine     Patient Active Problem List   Diagnosis Date Noted  . Dog bite 10/26/2017    History reviewed. No pertinent surgical history.   OB History   No obstetric history on file.      Home Medications    Prior to Admission medications   Medication Sig Start Date End Date Taking? Authorizing Provider  acetaminophen (TYLENOL) 500 MG tablet Take 1,000 mg by mouth every 6 (six) hours as needed for headache.    [provider]  amoxicillin-clavulanate (AUGMENTIN) 875-125 MG tablet Take 1 tablet by mouth 2 (two) times daily. Patient not taking: Reported on 12/04/2017 10/26/17   Frederica Kuster, MD  nortriptyline (PAMELOR) 25 MG capsule Take 1 capsule (25 mg total) by mouth at bedtime. 12/04/17   Drema Dallas, DO    nortriptyline (PAMELOR) 25 MG capsule Take 1 capsule (25 mg total) by mouth at bedtime. 12/04/17   Drema Dallas, DO    Family History Family History  Problem Relation Age of Onset  . Diabetes Mother   . Hypertension Mother   . Diabetes Maternal Grandmother   . Hypertension Maternal Grandmother   . High Cholesterol Maternal Grandmother   . CVA Maternal Grandfather     Social History Social History   Tobacco Use  . Smoking status: Never Smoker  . Smokeless tobacco: Never Used  Substance Use Topics  . Alcohol use: No  . Drug use: Yes    Types: Marijuana    Comment: a few times     Allergies   Patient has no known allergies.   Review of Systems Review of Systems  Neurological: Positive for headaches.  All other systems reviewed and are negative.    Physical Exam Updated Vital Signs BP 123/69 (BP Location: Left Arm)   Pulse 85   Temp 98.2 F (36.8 C) (Oral)   Resp 18   LMP 04/23/2018   SpO2 99%   Physical Exam Vitals signs and nursing note reviewed.  Constitutional:      General: She is not in acute distress.    Appearance: She is well-developed. She is not diaphoretic.     Comments: Sitting up on stretcher texting on cell phone, no  acute distress  HENT:     Head: Normocephalic and atraumatic.     Right Ear: External ear normal.     Left Ear: External ear normal.  Eyes:     Conjunctiva/sclera: Conjunctivae normal.     Pupils: Pupils are equal, round, and reactive to light.  Neck:     Musculoskeletal: Full passive range of motion without pain, normal range of motion and neck supple. No neck rigidity.     Comments: No rigidity, no meningismus Cardiovascular:     Rate and Rhythm: Normal rate and regular rhythm.     Heart sounds: Normal heart sounds. No murmur.  Pulmonary:     Effort: Pulmonary effort is normal. No respiratory distress.     Breath sounds: Normal breath sounds. No stridor. No wheezing or rhonchi.  Abdominal:     General: Bowel sounds  are normal.     Palpations: Abdomen is soft.     Tenderness: There is no abdominal tenderness. There is no guarding.  Musculoskeletal: Normal range of motion.  Skin:    General: Skin is warm and dry.     Findings: No rash.  Neurological:     Mental Status: She is alert and oriented to person, place, and time.     Cranial Nerves: No cranial nerve deficit.     Sensory: No sensory deficit.     Motor: No tremor or seizure activity.     Comments: AAOx3, answering questions and following commands appropriately; equal strength UE and LE bilaterally; CN grossly intact; moves all extremities appropriately without ataxia; no focal neuro deficits or facial asymmetry appreciated  Psychiatric:        Behavior: Behavior normal.        Thought Content: Thought content normal.      ED Treatments / Results  Labs (all labs ordered are listed, but only abnormal results are displayed) Labs Reviewed - No data to display  EKG None  Radiology No results found.  Procedures Procedures (including critical care time)  Medications Ordered in ED Medications  ketorolac (TORADOL) 30 MG/ML injection 30 mg (30 mg Intravenous Given 05/15/18 0138)  diphenhydrAMINE (BENADRYL) injection 25 mg (25 mg Intravenous Given 05/15/18 0138)  metoCLOPramide (REGLAN) injection 10 mg (10 mg Intravenous Given 05/15/18 0138)  sodium chloride 0.9 % bolus 1,000 mL (0 mLs Intravenous Stopped 05/15/18 0152)     Initial Impression / Assessment and Plan / ED Course  I have reviewed the triage vital signs and the nursing notes.  Pertinent labs & imaging results that were available during my care of the patient were reviewed by me and considered in my medical decision making (see chart for details).  23 year old female here with migraine headache.  Has been ongoing for over a week.  No worsening of pain, just remains persistent.  Described throbbing pain with alternating locations.  Some associated photophobia.  She is awake,  alert, appropriately oriented.  No focal neurologic deficits.  No signs or symptoms concerning for meningitis.  Patient has history of migraines and reports this feels consistent.  Plan for migraine cocktail.  Headache has resolved after medications given here.  Remains neurologically intact.  Very low suspicion for any acute intracranial pathology.  Feel she is stable for discharge home.  Recommended close follow-up with neurology if any ongoing issues.  She can return here for any new or acute changes.  Final Clinical Impressions(s) / ED Diagnoses   Final diagnoses:  Bad headache    ED Discharge Orders  None       Garlon Hatchet, PA-C 05/15/18 3500    Marily Memos, MD 05/15/18 [phone]

## 2018-05-15 NOTE — ED Notes (Signed)
Pt remains in waiting room. Updated on wait for treatment room. 

## 2018-05-15 NOTE — Discharge Instructions (Signed)
Glad you are feeling better. Make sure to stay hydrated. Can follow-up with your neurologist if any ongoing issues. Return here for any new/acute changes.

## 2018-05-17 NOTE — Progress Notes (Signed)
NEUROLOGY FOLLOW UP OFFICE NOTE  Tammy Randall [id number]  HISTORY OF PRESENT ILLNESS: Tammy Randall 23 year old left-handed woman with history of left-sided hemorrhagic stroke in utero who follows up for migraines.  UPDATE: She was prescribed nortriptyline but is only taking as needed. Intensity:  severe Duration:  2-3 weeks Frequency:  Every month Frequency of abortive medication: a couple of times a month She was seen in the ED on 05/14/18 for intractable headache where she received a headache cocktail of Toradol, Reglan and Benadryl. Current NSAIDS:   Sprix nasal spray (ineffective) Current analgesics:  Tylenol. Current triptans:  no Current ergotamine:  no Current anti-emetic:  no Current muscle relaxants:  no Current anti-anxiolytic:  no Current sleep aide:  no Current Antihypertensive medications:  no Current Antidepressant medications:   Nortriptyline 25 mg Current Anticonvulsant medications:  no Current anti-CGRP:  no Current Vitamins/Herbal/Supplements:  no Current Antihistamines/Decongestants:  no Other therapy:  no Other medication:  none  Caffeine:  no Alcohol:  rarely Smoker:  Marijuana once in awhile Diet:  Water, juice, rarely soda Exercise:  Trying to Depression:  sometimes; Anxiety:  yes Other pain:  Ankle pain Sleep hygiene:  poor.  She hasn't been following the sleep hygiene recommendations.  HISTORY:  Onset: Childhood Location:  Left sided, back of head Quality:  Pounding, stabbing/needles Initial intensity:  Severe.  She denies new headache, thunderclap headache Aura:  no Prodrome:  no Postdrome:  fatigue Associated symptoms: Photophobia, phonophobia, blurred vision.  Sometimes nausea.  She denies associated unilateral numbness or weakness. Initial duration:  2 to 3 weeks Initial Frequency:  Typically every 2 to 3 months Initial Frequency of abortive medication: Rarely Triggers: Emotional stress.  She says it doe snot seem to be  related to her menstrual cycle. Relieving factors: Nothing Activity:  Aggravates.  Cannot function  Past NSAIDS:  Naproxen 500mg , indomethacin 50mg , Cambia Past analgesics:  Tramadol (variable efficacy), Tylenol Past abortive triptans:  No Past abortive ergotamine:  No Past muscle relaxants:  baclofen Past anti-emetic:  no Past antihypertensive medications:  no Past antidepressant medications:  no Past anticonvulsant medications:  topiramate 100mg  twice daily, zonisamide Past anti-CGRP:  no Past vitamins/Herbal/Supplements:  none Past antihistamines/decongestants:  none Other past therapies:  none  Family history of headache:  Mom  PAST MEDICAL HISTORY: Past Medical History:  Diagnosis Date  . Asthma   . CP (cerebral palsy) (HCC)   . Migraine     MEDICATIONS: Current Outpatient Medications on File Prior to Visit  Medication Sig Dispense Refill  . amoxicillin-clavulanate (AUGMENTIN) 875-125 MG tablet Take 1 tablet by mouth 2 (two) times daily. (Patient not taking: Reported on 12/04/2017) 10 tablet 0  . nortriptyline (PAMELOR) 25 MG capsule Take 1 capsule (25 mg total) by mouth at bedtime. (Patient taking differently: Take 25 mg by mouth at bedtime as needed for sleep. ) 30 capsule 3  . nortriptyline (PAMELOR) 25 MG capsule Take 1 capsule (25 mg total) by mouth at bedtime. (Patient not taking: Reported on 05/15/2018) 30 capsule 3  . SUMAtriptan (IMITREX) 20 MG/ACT nasal spray Place 20 mg into the nose every 2 (two) hours as needed for migraine or headache. May repeat in 2 hours if headache persists or recurs.     No current facility-administered medications on file prior to visit.     ALLERGIES: No Known Allergies  FAMILY HISTORY: Family History  Problem Relation Age of Onset  . Diabetes Mother   . Hypertension Mother   . Diabetes Maternal Grandmother   .  Hypertension Maternal Grandmother   . High Cholesterol Maternal Grandmother   . CVA Maternal Grandfather    SOCIAL  HISTORY: Social History   Socioeconomic History  . Marital status: Single    Spouse name: Not on file  . Number of children: Not on file  . Years of education: Not on file  . Highest education level: 12th grade  Occupational History    Employer: [employer]  Social Needs  . Financial resource strain: Not on file  . Food insecurity:    Worry: Not on file    Inability: Not on file  . Transportation needs:    Medical: Not on file    Non-medical: Not on file  Tobacco Use  . Smoking status: Never Smoker  . Smokeless tobacco: Never Used  Substance and Sexual Activity  . Alcohol use: No  . Drug use: Yes    Types: Marijuana    Comment: a few times  . Sexual activity: Yes    Birth control/protection: Condom  Lifestyle  . Physical activity:    Days per week: Not on file    Minutes per session: Not on file  . Stress: Not on file  Relationships  . Social connections:    Talks on phone: Not on file    Gets together: Not on file    Attends religious service: Not on file    Active member of club or organization: Not on file    Attends meetings of clubs or organizations: Not on file    Relationship status: Not on file  . Intimate partner violence:    Fear of current or ex partner: Not on file    Emotionally abused: Not on file    Physically abused: Not on file    Forced sexual activity: Not on file  Other Topics Concern  . Not on file  Social History Narrative   Patient is left-handed. She live with her boyfriend in a 1 story house. She rarely drinks caffeine. She is active at work.    REVIEW OF SYSTEMS: Constitutional: No fevers, chills, or sweats, no generalized fatigue, change in appetite Eyes: No visual changes, double vision, eye pain Ear, nose and throat: No hearing loss, ear pain, nasal congestion, sore throat Cardiovascular: No chest pain, palpitations Respiratory:  No shortness of breath at rest or with exertion, wheezes GastrointestinaI: No nausea, vomiting,  diarrhea, abdominal pain, fecal incontinence Genitourinary:  No dysuria, urinary retention or frequency Musculoskeletal:  No neck pain, back pain Integumentary: No rash, pruritus, skin lesions Neurological: as above Psychiatric: No depression, insomnia, anxiety Endocrine: No palpitations, fatigue, diaphoresis, mood swings, change in appetite, change in weight, increased thirst Hematologic/Lymphatic:  No purpura, petechiae. Allergic/Immunologic: no itchy/runny eyes, nasal congestion, recent allergic reactions, rashes  PHYSICAL EXAM: Blood pressure 114/64, pulse 65, height 5\' 4"  (1.626 m), weight 260 lb (117.9 kg), last menstrual period 04/23/2018, SpO2 99 %. General: No acute distress.  Patient appears well-groomed.  Head:  Normocephalic/atraumatic Eyes:  Fundi examined but not visualized Neck: supple, no paraspinal tenderness, full range of motion Heart:  Regular rate and rhythm Lungs:  Clear to auscultation bilaterally Back: No paraspinal tenderness Neurological Exam: alert and oriented to person, place, and time. Attention span and concentration intact, recent and remote memory intact, fund of knowledge intact.  Speech fluent and not dysarthric, language intact.  CN II-XII intact. Bulk and tone normal, muscle strength 5/5 throughout.  Sensation to light touch, temperature and vibration intact.  Deep tendon reflexes 2+ throughout,  toes downgoing.  Finger to nose and heel to shin testing intact.  Gait normal, Romberg negative.  IMPRESSION: 1.  Migraine without aura, with status migrainosus, intractable.   2.  History of hemorrhagic stroke in utero  PLAN: 1.  For preventative management, instructed to take nortriptyline  at bedtime.  We can increase to  at bedtime in 4 weeks if needed 2.  For abortive therapy, Maxalt-MLT  3.  Limit use of pain relievers to no more than 2 days out of week to prevent risk of rebound or medication-overuse headache. 4.  Keep headache diary 5.   Exercise, hydration, caffeine cessation, sleep hygiene, monitor for and avoid triggers 6.  Consider:  magnesium citrate  daily, riboflavin  daily, and coenzyme Q10  three times daily 7. Given remote history of hemorrhagic stroke and intractable migraines, will check CT head.  8. Follow up in 4 months   Shon Millet, DO

## 2018-05-18 ENCOUNTER — Encounter: Payer: Self-pay | Admitting: Neurology

## 2018-05-18 ENCOUNTER — Ambulatory Visit (INDEPENDENT_AMBULATORY_CARE_PROVIDER_SITE_OTHER): Payer: BLUE CROSS/BLUE SHIELD | Admitting: Neurology

## 2018-05-18 VITALS — BP 114/64 | HR 65 | Ht 64.0 in | Wt 260.0 lb

## 2018-05-18 DIAGNOSIS — G43011 Migraine without aura, intractable, with status migrainosus: Secondary | ICD-10-CM

## 2018-05-18 DIAGNOSIS — I619 Nontraumatic intracerebral hemorrhage, unspecified: Secondary | ICD-10-CM

## 2018-05-18 MED ORDER — NORTRIPTYLINE HCL 25 MG PO CAPS: 25.0000 mg | ORAL_CAPSULE | Freq: Every day | ORAL | 3 refills | Status: DC

## 2018-05-18 MED ORDER — RIZATRIPTAN BENZOATE 10 MG PO TBDP: 10.0000 mg | ORAL_TABLET | ORAL | 3 refills | Status: DC | PRN

## 2018-05-18 NOTE — Patient Instructions (Addendum)
1.  For preventative management, start nortriptyline 25mg  at bedtime.  Contact me in 4 weeks with update and we can increase dose if needed. 2.  For abortive therapy, take rizatriptan 10mg .  May repeat dose once after 2 hours if needed.  Maximum 2 tablets in 24 hours 3.  Limit use of pain relievers to no more than 2 days out of week to prevent risk of rebound or medication-overuse headache. 4.  Keep headache diary 5.  Exercise, hydration, caffeine cessation, sleep hygiene, monitor for and avoid triggers 6.  Consider:  magnesium citrate 400mg  daily, riboflavin 400mg  daily, and coenzyme Q10 100mg  three times daily 7.  We will check CT head 8. Follow up in 4 months   We have sent a referral to [facility] for your CT and they will call you directly to schedule your appt. They are located at [address] [facility]. If you need to contact them directly please call [phone].

## 2018-06-03 ENCOUNTER — Ambulatory Visit
Admission: RE | Admit: 2018-06-03 | Discharge: 2018-06-03 | Disposition: A | Discharge status: Home / Self Care | Payer: BLUE CROSS/BLUE SHIELD | Source: Ambulatory Visit | Attending: Neurology | Admitting: Neurology

## 2018-06-03 DIAGNOSIS — I619 Nontraumatic intracerebral hemorrhage, unspecified: Secondary | ICD-10-CM

## 2018-06-07 ENCOUNTER — Telehealth: Payer: Self-pay

## 2018-06-07 NOTE — Telephone Encounter (Signed)
Called and advised Pt of CT results

## 2018-06-07 NOTE — Telephone Encounter (Signed)
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

## 2018-08-18 ENCOUNTER — Ambulatory Visit: Payer: BLUE CROSS/BLUE SHIELD | Admitting: Neurology

## 2018-08-28 ENCOUNTER — Ambulatory Visit (HOSPITAL_COMMUNITY)
Admission: EM | Admit: 2018-08-28 | Discharge: 2018-08-28 | Disposition: A | Discharge status: Home / Self Care | Payer: BC Managed Care – PPO | Attending: Family Medicine | Admitting: Family Medicine

## 2018-08-28 ENCOUNTER — Other Ambulatory Visit: Payer: Self-pay

## 2018-08-28 ENCOUNTER — Ambulatory Visit (INDEPENDENT_AMBULATORY_CARE_PROVIDER_SITE_OTHER): Payer: BC Managed Care – PPO

## 2018-08-28 ENCOUNTER — Encounter (HOSPITAL_COMMUNITY): Payer: Self-pay | Admitting: Emergency Medicine

## 2018-08-28 DIAGNOSIS — Z3202 Encounter for pregnancy test, result negative: Secondary | ICD-10-CM | POA: Diagnosis not present

## 2018-08-28 DIAGNOSIS — R1031 Right lower quadrant pain: Secondary | ICD-10-CM | POA: Insufficient documentation

## 2018-08-28 LAB — POCT URINALYSIS DIP (DEVICE)
Bilirubin Urine: NEGATIVE
Glucose, UA: NEGATIVE mg/dL
Hgb urine dipstick: NEGATIVE
Ketones, ur: NEGATIVE mg/dL
Nitrite: NEGATIVE
Protein, ur: 30 mg/dL — AB
Specific Gravity, Urine: 1.02 (ref 1.005–1.030)
Urobilinogen, UA: 1 mg/dL (ref 0.0–1.0)
pH: 8.5 — ABNORMAL HIGH (ref 5.0–8.0)

## 2018-08-28 LAB — POCT PREGNANCY, URINE: Preg Test, Ur: NEGATIVE

## 2018-08-28 MED ORDER — POLYETHYLENE GLYCOL 3350 17 GM/SCOOP PO POWD: 1.0000 | Freq: Every day | ORAL | 0 refills | Status: DC

## 2018-08-28 NOTE — Discharge Instructions (Addendum)
You need to take Miralax, 1 scoop in 8 oz water or juice, twice daily for 5 days

## 2018-08-28 NOTE — ED Provider Notes (Signed)
[place]    CSN: [id number] Arrival date & time: 08/28/18  1336     History   Chief Complaint Chief Complaint  Patient presents with  . Abdominal Pain    HPI Tammy Randall is a 23 y.o. female.   Established [place] patient, 23 yo female  Per pt she has been having RuQ pain that is sharp and nagging. Pt said when she woke up it was hard to catch her breath. Pt said the pain was sop sharp. No vomiting no nausea No fevers.   The pain began about a week ago and is intermittent, worse today.  No nausea, vomiting, fever, or diarrhea.  The pain is not worse after meals.  No loss of appetite.  Pain is not present at night.  No cough.  No urinary symptoms.     Past Medical History:  Diagnosis Date  . Asthma   . CP (cerebral palsy) ([place])   . Migraine     Patient Active Problem List   Diagnosis Date Noted  . Dog bite 10/26/2017    History reviewed. No pertinent surgical history.  OB History   No obstetric history on file.      Home Medications    Prior to Admission medications   Medication Sig Start Date End Date Taking? Authorizing Provider  nortriptyline (PAMELOR) 25 MG capsule Take 1 capsule (25 mg total) by mouth at bedtime. 05/18/18   Pieter Partridge, DO  polyethylene glycol powder (MIRALAX) 17 GM/SCOOP powder Take 255 g by mouth daily. 08/28/18   Robyn Haber, MD  rizatriptan (MAXALT-MLT) 10 MG disintegrating tablet Take 1 tablet (10 mg total) by mouth as needed for migraine. May repeat in 2 hours if needed.  Maximum 2 tablets in 24 hours 05/18/18   Pieter Partridge, DO  SUMAtriptan (IMITREX) 20 MG/ACT nasal spray Place 20 mg into the nose every 2 (two) hours as needed for migraine or headache. May repeat in 2 hours if headache persists or recurs.    [provider]    Family History Family History  Problem Relation Age of Onset  . Diabetes Mother   . Hypertension Mother   . Diabetes Maternal Grandmother   . Hypertension Maternal  Grandmother   . High Cholesterol Maternal Grandmother   . CVA Maternal Grandfather     Social History Social History   Tobacco Use  . Smoking status: Never Smoker  . Smokeless tobacco: Never Used  Substance Use Topics  . Alcohol use: No  . Drug use: Yes    Types: Marijuana    Comment: a few times     Allergies   Patient has no known allergies.   Review of Systems Review of Systems  Gastrointestinal: Positive for abdominal pain.  All other systems reviewed and are negative.    Physical Exam Triage Vital Signs ED Triage Vitals  Enc Vitals Group     BP 08/28/18 1401 135/85     Pulse Rate 08/28/18 1401 98     Resp 08/28/18 1401 16     Temp 08/28/18 1401 98.4 F (36.9 C)     Temp Source 08/28/18 1401 Oral     SpO2 08/28/18 1401 99 %     Weight --      Height --      Head Circumference --      Peak Flow --      Pain Score 08/28/18 1402 8     Pain Loc --  Pain Edu? --      Excl. in GC? --    No data found.  Updated Vital Signs BP 135/85 (BP Location: Right Arm)   Pulse 98   Temp 98.4 F (36.9 C) (Oral)   Resp 16   SpO2 99%    Physical Exam Vitals signs and nursing note reviewed.  Constitutional:      Appearance: She is well-developed. She is obese.  HENT:     Head: Normocephalic.  Eyes:     Extraocular Movements: Extraocular movements intact.  Cardiovascular:     Rate and Rhythm: Normal rate and regular rhythm.     Heart sounds: Normal heart sounds.  Pulmonary:     Effort: Pulmonary effort is normal.     Breath sounds: Normal breath sounds.  Abdominal:     General: Abdomen is flat. Bowel sounds are normal.     Palpations: Abdomen is soft.     Tenderness: There is abdominal tenderness in the right upper quadrant and right lower quadrant. There is no guarding or rebound.  Skin:    General: Skin is warm and dry.  Neurological:     General: No focal deficit present.     Mental Status: She is alert and oriented to person, place, and time.   Psychiatric:        Mood and Affect: Mood normal.        Behavior: Behavior normal.      UC Treatments / Results  Labs (all labs ordered are listed, but only abnormal results are displayed) Labs Reviewed  POCT URINALYSIS DIP (DEVICE) - Abnormal; Notable for the following components:      Result Value   pH 8.5 (*)    Protein, ur 30 (*)    Leukocytes,Ua TRACE (*)    All other components within normal limits  URINE CULTURE  POC URINE PREG, ED  POCT PREGNANCY, URINE    EKG None  Radiology KUB:  Stool burden increase RUQ Procedures Procedures (including critical care time)  Medications Ordered in UC Medications - No data to display  Initial Impression / Assessment and Plan / UC Course  I have reviewed the triage vital signs and the nursing notes.  Pertinent labs & imaging results that were available during my care of the patient were reviewed by me and considered in my medical decision making (see chart for details).    Final Clinical Impressions(s) / UC Diagnoses   Final diagnoses:  Right lower quadrant abdominal pain     Discharge Instructions     You need to take Miralax, 1 scoop in 8 oz water or juice, twice daily for 5 days    ED Prescriptions    Medication Sig Dispense Auth. Provider   polyethylene glycol powder (MIRALAX) 17 GM/SCOOP powder Take 255 g by mouth daily. 255 g Elvina SidleLauenstein, Aundreya Souffrant, MD     Controlled Substance Prescriptions [place] Controlled Substance Registry consulted? Not Applicable   Elvina SidleLauenstein, Illya Gienger, MD 08/28/18 [phone]

## 2018-08-28 NOTE — ED Triage Notes (Signed)
Per pt she has been having RuQ pain that is sharp and nagging. Pt said when she woke up it was hard to catch her breath. Pt said the pain was sop sharp. No vomiting no nausea No fevers.

## 2018-08-29 ENCOUNTER — Encounter (HOSPITAL_COMMUNITY): Payer: Self-pay | Admitting: Emergency Medicine

## 2018-08-29 ENCOUNTER — Other Ambulatory Visit: Payer: Self-pay

## 2018-08-29 DIAGNOSIS — J45909 Unspecified asthma, uncomplicated: Secondary | ICD-10-CM | POA: Insufficient documentation

## 2018-08-29 DIAGNOSIS — Z79899 Other long term (current) drug therapy: Secondary | ICD-10-CM | POA: Insufficient documentation

## 2018-08-29 DIAGNOSIS — R1031 Right lower quadrant pain: Secondary | ICD-10-CM | POA: Diagnosis present

## 2018-08-29 DIAGNOSIS — J181 Lobar pneumonia, unspecified organism: Secondary | ICD-10-CM | POA: Insufficient documentation

## 2018-08-29 LAB — I-STAT BETA HCG BLOOD, ED (MC, WL, AP ONLY): I-stat hCG, quantitative: <5 m[IU]/mL (ref <5)

## 2018-08-29 LAB — URINALYSIS, ROUTINE W REFLEX MICROSCOPIC
Bilirubin Urine: NEGATIVE
Glucose, UA: NEGATIVE mg/dL
Hgb urine dipstick: NEGATIVE
Ketones, ur: NEGATIVE mg/dL
Nitrite: NEGATIVE
Protein, ur: NEGATIVE mg/dL
Specific Gravity, Urine: 1.024 (ref 1.005–1.030)
pH: 7 (ref 5.0–8.0)

## 2018-08-29 LAB — COMPREHENSIVE METABOLIC PANEL
ALT: 11 U/L (ref 0–44)
AST: 15 U/L (ref 15–41)
Albumin: 3.9 g/dL (ref 3.5–5.0)
Alkaline Phosphatase: 61 U/L (ref 38–126)
Anion gap: 7 (ref 5–15)
BUN: 8 mg/dL (ref 6–20)
CO2: 25 mmol/L (ref 22–32)
Calcium: 8.7 mg/dL — ABNORMAL LOW (ref 8.9–10.3)
Chloride: 107 mmol/L (ref 98–111)
Creatinine, Ser: 0.5 mg/dL (ref 0.44–1.00)
GFR calc Af Amer: >60 mL/min (ref >60)
GFR calc non Af Amer: >60 mL/min (ref >60)
Glucose, Bld: 78 mg/dL (ref 70–99)
Potassium: 3.6 mmol/L (ref 3.5–5.1)
Sodium: 139 mmol/L (ref 135–145)
Total Bilirubin: 0.1 mg/dL — ABNORMAL LOW (ref 0.3–1.2)
Total Protein: 7.5 g/dL (ref 6.5–8.1)

## 2018-08-29 LAB — CBC
HCT: 39.7 % (ref 36.0–46.0)
Hemoglobin: 12.7 g/dL (ref 12.0–15.0)
MCH: 28.6 pg (ref 26.0–34.0)
MCHC: 32 g/dL (ref 30.0–36.0)
MCV: 89.4 fL (ref 80.0–100.0)
Platelets: 274 10*3/uL (ref 150–400)
RBC: 4.44 MIL/uL (ref 3.87–5.11)
RDW: 13.5 % (ref 11.5–15.5)
WBC: 12.2 10*3/uL — ABNORMAL HIGH (ref 4.0–10.5)
nRBC: 0 % (ref 0.0–0.2)

## 2018-08-29 LAB — URINE CULTURE: Culture: 70000 — AB

## 2018-08-29 LAB — LIPASE, BLOOD: Lipase: 22 U/L (ref 11–51)

## 2018-08-29 MED ORDER — SODIUM CHLORIDE 0.9% FLUSH
3.0000 mL | Freq: Once | INTRAVENOUS | Status: AC
  Administered 2018-08-30: 3 mL via INTRAVENOUS

## 2018-08-29 NOTE — ED Triage Notes (Signed)
Pt reports RLQ pain that started over a week ago and is now worsening. Pt was seen at urgent care and dx with constipation. Pt reported to having xray but not a CT scan. Pt states she has been having vomiting and diarrhea.

## 2018-08-30 ENCOUNTER — Emergency Department (HOSPITAL_COMMUNITY): Payer: BC Managed Care – PPO

## 2018-08-30 ENCOUNTER — Emergency Department (HOSPITAL_COMMUNITY)
Admission: EM | Admit: 2018-08-30 | Discharge: 2018-08-30 | Disposition: A | Discharge status: Home / Self Care | Payer: BC Managed Care – PPO | Attending: Emergency Medicine | Admitting: Emergency Medicine

## 2018-08-30 ENCOUNTER — Encounter (HOSPITAL_COMMUNITY): Payer: Self-pay

## 2018-08-30 DIAGNOSIS — J189 Pneumonia, unspecified organism: Secondary | ICD-10-CM

## 2018-08-30 MED ORDER — IOHEXOL 300 MG/ML  SOLN
100.0000 mL | Freq: Once | INTRAMUSCULAR | Status: AC | PRN
  Administered 2018-08-30: 100 mL via INTRAVENOUS

## 2018-08-30 MED ORDER — AZITHROMYCIN 250 MG PO TABS: 250.0000 mg | ORAL_TABLET | Freq: Every day | ORAL | 0 refills | Status: DC

## 2018-08-30 MED ORDER — KETOROLAC TROMETHAMINE 30 MG/ML IJ SOLN
30.0000 mg | Freq: Once | INTRAMUSCULAR | Status: AC
  Administered 2018-08-30: 03:00:00 30 mg via INTRAVENOUS
  Filled 2018-08-30: qty 1

## 2018-08-30 MED ORDER — DOXYCYCLINE HYCLATE 100 MG PO CAPS: 100.0000 mg | ORAL_CAPSULE | Freq: Two times a day (BID) | ORAL | 0 refills | Status: DC

## 2018-08-30 NOTE — Discharge Instructions (Signed)
Thank you for allowing me to care for you today in the Emergency Department.   To treat pneumonia, take doxycycline and azithromycin.  Make sure to finish the entire course of antibiotics to avoid recurrence.  Call the number on your discharge paperwork to get established with a primary care provider.  For pain control, you can take 600 mg of ibuprofen with food or 600 mg of Tylenol once every 6 hours or alternate between these 2 medications every 3 hours.  You can take Imodium, which is available over-the-counter, for diarrhea.  Use as directed on the label.  Return to the emergency department if you develop uncontrollable vomiting, high fever that does not improve with Tylenol or ibuprofen, respiratory distress, or other new, concerning symptoms.

## 2018-08-30 NOTE — ED Provider Notes (Signed)
[place] DEPT Provider Note   CSN: [id number] Arrival date & time: 08/29/18  2046    History   Chief Complaint Chief Complaint  Patient presents with  . Abdominal Pain    HPI Tammy Randall is a 23 y.o. female with a history of asthma, cerebral palsy, and migraines who presents to the emergency department with a chief complaint of abdominal pain.  She reports that she has been having pain in her right lower quadrant 1 week ago that is sharp and stabbing.  She reports the pain was intermittent until 2 days ago when the pain became constant.  She also reports that she has been having some pain in her right upper quadrant that causes her to feel short of breath.  Pain is worse when she lays on her right side.  She also reports that she is intermittently been having some pain in her right shoulder.  She reports that approximately a week ago that she had a fever and vomiting for 2 to 3 days that has since resolved.  She reports that she has been having approximately 3 episodes of watery diarrhea daily for the last week.  She denies chills.  She has had an infrequent dry cough.  She denies dysuria, hematuria, constipation, palpitations, leg swelling, or vaginal pain, discharge, or bleeding.  LMP was May 20.  She is sexually active with one female partner and does not use protection.  She does not use contraceptives.  She reports that she was seen for the same at urgent care 2 days ago and was diagnosed with constipation.  She is advised to start taking MiraLAX, which she has not yet initiated.  No history of abdominal surgery.  No recent travel.  No known sick contacts.  No known or suspected COVID-19 contacts.     The history is provided by the patient. No language interpreter was used.    Past Medical History:  Diagnosis Date  . Asthma   . CP (cerebral palsy) ([place])   . Migraine     Patient Active Problem List   Diagnosis Date Noted  . Dog bite  10/26/2017    History reviewed. No pertinent surgical history.   OB History   No obstetric history on file.      Home Medications    Prior to Admission medications   Medication Sig Start Date End Date Taking? Authorizing Provider  azithromycin (ZITHROMAX) 250 MG tablet Take 1 tablet (250 mg total) by mouth daily. Take first 2 tablets together, then 1 every day until finished. 08/30/18   Misael Mcgaha A, PA-C  doxycycline (VIBRAMYCIN) 100 MG capsule Take 1 capsule (100 mg total) by mouth 2 (two) times daily. 08/30/18   Skarlet Lyons A, PA-C  nortriptyline (PAMELOR) 25 MG capsule Take 1 capsule (25 mg total) by mouth at bedtime. 05/18/18   Pieter Partridge, DO  polyethylene glycol powder (MIRALAX) 17 GM/SCOOP powder Take 255 g by mouth daily. 08/28/18   Robyn Haber, MD  rizatriptan (MAXALT-MLT) 10 MG disintegrating tablet Take 1 tablet (10 mg total) by mouth as needed for migraine. May repeat in 2 hours if needed.  Maximum 2 tablets in 24 hours 05/18/18   Pieter Partridge, DO  SUMAtriptan (IMITREX) 20 MG/ACT nasal spray Place 20 mg into the nose every 2 (two) hours as needed for migraine or headache. May repeat in 2 hours if headache persists or recurs.    [provider]    Family History Family History  Problem Relation Age of Onset  . Diabetes Mother   . Hypertension Mother   . Diabetes Maternal Grandmother   . Hypertension Maternal Grandmother   . High Cholesterol Maternal Grandmother   . CVA Maternal Grandfather     Social History Social History   Tobacco Use  . Smoking status: Never Smoker  . Smokeless tobacco: Never Used  Substance Use Topics  . Alcohol use: No  . Drug use: Yes    Types: Marijuana    Comment: a few times     Allergies   Patient has no known allergies.   Review of Systems Review of Systems  Constitutional: Positive for fever (resolved). Negative for activity change and chills.  Respiratory: Positive for shortness of breath.    Cardiovascular: Negative for chest pain.  Gastrointestinal: Positive for abdominal pain, diarrhea and vomiting (resolved). Negative for anal bleeding, blood in stool and constipation.  Genitourinary: Negative for dysuria, flank pain, frequency, urgency, vaginal bleeding, vaginal discharge and vaginal pain.  Musculoskeletal: Positive for arthralgias (right shoulder), back pain and myalgias. Negative for neck pain and neck stiffness.  Skin: Negative for rash.  Allergic/Immunologic: Negative for immunocompromised state.  Neurological: Negative for headaches.  Psychiatric/Behavioral: Negative for confusion.   Physical Exam Updated Vital Signs BP (!) 127/110 (BP Location: Left Arm)   Pulse 88   Temp 98.9 F (37.2 C) (Oral)   Resp (!) 23   Ht 5\' 4"  (1.626 m)   Wt 120.2 kg   LMP 08/11/2018   SpO2 100%   BMI 45.49 kg/m   Physical Exam Vitals signs and nursing note reviewed.  Constitutional:      General: She is not in acute distress.    Appearance: She is not ill-appearing, toxic-appearing or diaphoretic.  HENT:     Head: Normocephalic.  Eyes:     General: No scleral icterus.    Conjunctiva/sclera: Conjunctivae normal.  Neck:     Musculoskeletal: Normal range of motion and neck supple.  Cardiovascular:     Rate and Rhythm: Normal rate and regular rhythm.     Heart sounds: No murmur. No friction rub. No gallop.   Pulmonary:     Effort: Pulmonary effort is normal. No respiratory distress.     Breath sounds: No stridor. No wheezing, rhonchi or rales.  Abdominal:     General: There is no distension.     Palpations: Abdomen is soft. There is no mass.     Tenderness: There is abdominal tenderness. There is no right CVA tenderness, left CVA tenderness, guarding or rebound.     Hernia: No hernia is present.     Comments: Moderately tender to palpation in the epigastric and right upper quadrant.  Negative Murphy sign.  No CVA tenderness bilaterally.  She is tender to palpation in the  right lower quadrant.  No tenderness over McBurney's point.  Abdomen is obese, soft, nondistended.  No rebound or guarding.  Musculoskeletal:     Right lower leg: No edema.     Left lower leg: No edema.  Skin:    General: Skin is warm.     Findings: No rash.  Neurological:     Mental Status: She is alert.  Psychiatric:        Behavior: Behavior normal.      ED Treatments / Results  Labs (all labs ordered are listed, but only abnormal results are displayed) Labs Reviewed  COMPREHENSIVE METABOLIC PANEL - Abnormal; Notable for the following components:  Result Value   Calcium 8.7 (*)    Total Bilirubin 0.1 (*)    All other components within normal limits  CBC - Abnormal; Notable for the following components:   WBC 12.2 (*)    All other components within normal limits  URINALYSIS, ROUTINE W REFLEX MICROSCOPIC - Abnormal; Notable for the following components:   Leukocytes,Ua TRACE (*)    Bacteria, UA RARE (*)    All other components within normal limits  LIPASE, BLOOD  I-STAT BETA HCG BLOOD, ED ([facility], [facility], [facility] ONLY)    EKG None  Radiology Dg Abd 1 View  Result Date: 08/28/2018 CLINICAL DATA:  Abdominal pain EXAM: ABDOMEN - 1 VIEW COMPARISON:  None. FINDINGS: There is moderate stool in the colon. There is a paucity of bowel gas. There is no appreciable bowel dilatation or air-fluid level to suggest bowel obstruction. No evident free air. No abnormal calcifications. IMPRESSION: Moderate stool in colon. There is a paucity of bowel gas. This finding may be seen normally but also may be indicative of enteritis or early ileus. Bowel obstruction not felt be likely. No evident free air. Electronically Signed   By: Bretta BangWilliam  Woodruff III M.D.   On: 08/28/2018 15:18   Ct Abdomen Pelvis W Contrast  Result Date: 08/30/2018 CLINICAL DATA:  23 year old female with progressive right lower quadrant pain for a week. EXAM: CT ABDOMEN AND PELVIS WITH CONTRAST TECHNIQUE: Multidetector CT imaging of  the abdomen and pelvis was performed using the standard protocol following bolus administration of intravenous contrast. CONTRAST:  100mL OMNIPAQUE IOHEXOL 300 MG/ML  SOLN COMPARISON:  Abdominal x-rays 08/28/2018. FINDINGS: Lower chest: Mild dependent opacity most suggestive of atelectasis. No pericardial or pleural effusion. Mild elevation of the right hemidiaphragm. Hepatobiliary: Negative liver and gallbladder. Pancreas: Negative. Spleen: Negative. Adrenals/Urinary Tract: Normal adrenal glands. Renal enhancement appears symmetric and within normal limits. No hydronephrosis. Proximal ureters are decompressed. No perinephric stranding. Early renal contrast excretion suspected, no definite nephrolithiasis. Diminutive and unremarkable urinary bladder. Stomach/Bowel: Decompressed and negative descending and rectosigmoid colon. Mildly redundant splenic flexure. Negative transverse and right colon. Normal retrocecal appendix (series 2, image 47). Negative terminal ileum. No dilated small bowel. Stomach appears negative. No free air, free fluid. Vascular/Lymphatic: Suboptimal intravascular contrast bolus but the major arterial structures and portal venous system appear to be patent. No lymphadenopathy. Reproductive: Within normal limits. Other: No pelvic free fluid. Musculoskeletal: Negative. IMPRESSION: Normal appendix. Negative CT Abdomen and Pelvis. Mild lung base opacity most resembling atelectasis. Electronically Signed   By: Odessa FlemingH  Hall M.D.   On: 08/30/2018 03:10    Procedures Procedures (including critical care time)  Medications Ordered in ED Medications  sodium chloride flush (NS) 0.9 % injection 3 mL (3 mLs Intravenous Given 08/30/18 0233)  ketorolac (TORADOL) 30 MG/ML injection 30 mg (30 mg Intravenous Given 08/30/18 0232)  iohexol (OMNIPAQUE) 300 MG/ML solution 100 mL (100 mLs Intravenous Contrast Given 08/30/18 0253)     Initial Impression / Assessment and Plan / ED Course  I have reviewed the triage  vital signs and the nursing notes.  Pertinent labs & imaging results that were available during my care of the patient were reviewed by me and considered in my medical decision making (see chart for details).        23 year old female history of asthma, cerebral palsy, and migraines senting with abdominal pain for the last week.  She is having associated diarrhea and shortness of breath.  On exam, she has tenderness to palpation in  the right lower quadrant.  Doubt appendicitis as her pain is been ongoing for a week.  She does have a mild leukocytosis of 12.2.  Considered complicated ovarian cyst given that the patient is here ovulation can be having mittelschmerz pain.  She is having no urinary symptoms doubt obstructive uropathy or pyelonephritis.  Reviewed x-ray from urgent care which was concerning for constipation versus enteritis.  Given continued watery diarrhea and patient's age, will order CT abdomen pelvis to further assess for ulcerative colitis versus Crohn's disease versus enteritis postevent or other abdominal pathology.  CT exam with mild lung base opacity could represent atelectasis.  Exam is otherwise unremarkable.  Says she is also having shortness of breath and some right shoulder pain, could be reasonable to treat her for pneumonia.  We discussed, 19 testing although work-up does not appear consistent with COVID and opacity is unilateral.  She declines 19 testing at this time.  She reports the pain is much better after Toradol.  Will discharge with doxycycline and azithromycin.  She is hemodynamically stable in no acute distress.  Recommended outpatient follow-up with PCP.  Return precautions given.  She is safe for discharge home with outpatient follow-up at this time.  Final Clinical Impressions(s) / ED Diagnoses   Final diagnoses:  Pneumonia of right lower lobe due to infectious organism [facility](HCC)    ED Discharge Orders         Ordered    doxycycline (VIBRAMYCIN) 100 MG capsule  2  times daily     08/30/18 0359    azithromycin (ZITHROMAX) 250 MG tablet  Daily     08/30/18 0359           Barkley BoardsMcDonald, Lisset Ketchem A, PA-C 08/30/18 0410    Zadie RhineWickline, Donald, MD 08/30/18 [phone]

## 2018-09-21 ENCOUNTER — Ambulatory Visit: Payer: BLUE CROSS/BLUE SHIELD | Admitting: Neurology

## 2018-11-09 NOTE — Progress Notes (Signed)
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  She has been off of nortriptyline for 6 months.  She left it at a hotel and wasn't able to get a refill.  She was actually doing well until 2 or 3 weeks ago.  She started having constant intractable headache of various intensity.  She no longer has rizatriptan and   She was seen in the ED on 05/14/18 for intractable headache where she received a headache cocktail of Toradol, Reglan and Benadryl. Current NSAIDS: None Current analgesics:Tylenol. Current triptans:Maxalt 10mg  (without) Current ergotamine:no Current anti-emetic:no Current muscle relaxants:no Current anti-anxiolytic:no Current sleep aide:no Current Antihypertensive medications:no Current Antidepressant medications: Nortriptyline 25 mg (without) Current Anticonvulsant medications:no Current anti-CGRP:no Current Vitamins/Herbal/Supplements:no Current Antihistamines/Decongestants:no Other therapy:no Other medication:none  Caffeine:no Alcohol:rarely Smoker:Marijuana once in awhile Diet:Water, juice, rarely soda Exercise:Trying to Depression:sometimes; Anxiety:yes Other pain:Ankle pain Sleep hygiene:poor.  She hasn't been following the sleep hygiene recommendations.  HISTORY:  Onset: Childhood Location:Left sided, back of head Quality:Pounding, stabbing/needles Initial intensity:Severe. Shedenies new headache, thunderclap headache Aura:no Prodrome:no Postdrome:fatigue Associated symptoms: Photophobia, phonophobia, blurred vision. Sometimes nausea.Shedenies associated unilateral  numbness or weakness. Initial duration:2 to 3 weeks Initial Frequency:Typically every 2 to 3 months Initial Frequency of abortive medication:Rarely Triggers: Emotional stress.  She says it doe snot seem to be related to her menstrual cycle. Relieving factors: Nothing Activity:Aggravates. Cannot function  Past NSAIDS:Naproxen 500mg , indomethacin 50mg , Cambia, Sprix NS Past analgesics:Tramadol (variable efficacy), Tylenol Past abortive triptans:No Past abortive ergotamine:No Past muscle relaxants:baclofen Past anti-emetic:no Past antihypertensive medications:no Past antidepressant medications:no Past anticonvulsant medications:topiramate 100mg  twice daily, zonisamide Past anti-CGRP:no Past vitamins/Herbal/Supplements:none Past antihistamines/decongestants:none Other past therapies:none  Family history of headache:Mom  PAST MEDICAL HISTORY: Past Medical History:  Diagnosis Date  . Asthma   . CP (cerebral palsy) (HCC)   . Migraine     MEDICATIONS: Current Outpatient Medications on File Prior to Visit  Medication Sig Dispense Refill  . azithromycin (ZITHROMAX) 250 MG tablet Take 1 tablet (250 mg total) by mouth daily. Take first 2 tablets together, then 1 every day until finished. 6 tablet 0  . doxycycline (VIBRAMYCIN) 100 MG capsule Take 1 capsule (100 mg total) by mouth 2 (two) times daily. 20 capsule 0  . nortriptyline (PAMELOR) 25 MG capsule Take 1 capsule (25 mg total) by mouth at bedtime. 30 capsule 3  . polyethylene glycol powder (MIRALAX) 17 GM/SCOOP powder Take 255 g by mouth daily. 255 g 0  . rizatriptan (MAXALT-MLT) 10 MG disintegrating tablet Take 1 tablet (10 mg total) by mouth as needed for migraine. May repeat in 2 hours if needed.  Maximum 2 tablets in 24 hours 9 tablet 3  . SUMAtriptan (IMITREX) 20 MG/ACT nasal spray Place 20 mg into the nose every 2 (two) hours as needed for migraine or headache. May repeat in 2 hours if  headache persists or recurs.     No current facility-administered medications on file prior to visit.     ALLERGIES: No Known Allergies  FAMILY HISTORY: Family History  Problem Relation Age of Onset  . Diabetes Mother   . Hypertension Mother   . Diabetes Maternal Grandmother   . Hypertension Maternal Grandmother   . High Cholesterol Maternal Grandmother   .  CVA Maternal Grandfather    SOCIAL HISTORY: Social History   Socioeconomic History  . Marital status: Single    Spouse name: Not on file  . Number of children: Not on file  . Years of education: Not on file  . Highest education level: 12th grade  Occupational History    Employer: [employer]  Social Needs  . Financial resource strain: Not on file  . Food insecurity    Worry: Not on file    Inability: Not on file  . Transportation needs    Medical: Not on file    Non-medical: Not on file  Tobacco Use  . Smoking status: Never Smoker  . Smokeless tobacco: Never Used  Substance and Sexual Activity  . Alcohol use: No  . Drug use: Yes    Types: Marijuana    Comment: a few times  . Sexual activity: Yes    Birth control/protection: Condom  Lifestyle  . Physical activity    Days per week: Not on file    Minutes per session: Not on file  . Stress: Not on file  Relationships  . Social Musicianconnections    Talks on phone: Not on file    Gets together: Not on file    Attends religious service: Not on file    Active member of club or organization: Not on file    Attends meetings of clubs or organizations: Not on file    Relationship status: Not on file  . Intimate partner violence    Fear of current or ex partner: Not on file    Emotionally abused: Not on file    Physically abused: Not on file    Forced sexual activity: Not on file  Other Topics Concern  . Not on file  Social History Narrative   Patient is left-handed. She live with her boyfriend in a 1 story house. She rarely drinks caffeine. She is active at  work.    REVIEW OF SYSTEMS: Constitutional: No fevers, chills, or sweats, no generalized fatigue, change in appetite Eyes: No visual changes, double vision, eye pain Ear, nose and throat: No hearing loss, ear pain, nasal congestion, sore throat Cardiovascular: No chest pain, palpitations Respiratory:  No shortness of breath at rest or with exertion, wheezes GastrointestinaI: No nausea, vomiting, diarrhea, abdominal pain, fecal incontinence Genitourinary:  No dysuria, urinary retention or frequency Musculoskeletal:  No neck pain, back pain Integumentary: No rash, pruritus, skin lesions Neurological: as above Psychiatric: No depression, insomnia, anxiety Endocrine: No palpitations, fatigue, diaphoresis, mood swings, change in appetite, change in weight, increased thirst Hematologic/Lymphatic:  No purpura, petechiae. Allergic/Immunologic: no itchy/runny eyes, nasal congestion, recent allergic reactions, rashes  PHYSICAL EXAM: Blood pressure 115/73, pulse 81, temperature 99 F (37.2 C), height 5\' 4"  (1.626 m), weight 280 lb (127 kg), SpO2 98 %. General: No acute distress.  Patient appears well-groomed.   Head:  Normocephalic/atraumatic Eyes:  Fundi examined but not visualized Neck: supple, no paraspinal tenderness, full range of motion Heart:  Regular rate and rhythm Lungs:  Clear to auscultation bilaterally Back: No paraspinal tenderness Neurological Exam: alert and oriented to person, place, and time. Attention span and concentration intact, recent and remote memory intact, fund of knowledge intact.  Speech fluent and not dysarthric, language intact.  CN II-XII intact. Bulk and tone normal, muscle strength 5/5 throughout.  Sensation to light touch, temperature and vibration intact.  Deep tendon reflexes 2+ throughout, toes downgoing.  Finger to nose and heel to shin testing intact.  Gait normal, Romberg negative.  IMPRESSION: Migraine without aura, without status migrainosus,  intractable  PLAN: 1.  For preventative management, restart nortriptyline 25mg  at bedtime 2.  For abortive therapy, refilled rizatriptan or sumatriptan NS 3.  Limit use of pain relievers to no more than 2 days out of week to prevent risk of rebound or medication-overuse headache. 4.  Keep headache diary 5.  Exercise, hydration, caffeine cessation, sleep hygiene, monitor for and avoid triggers 6.  Consider:  magnesium citrate 400mg  daily, riboflavin 400mg  daily, and coenzyme Q10 100mg  three times daily 7. Always keep in mind that currently taking a hormone or birth control may be a possible trigger or aggravating factor for migraine. 8. Follow up 4 months  Metta Clines, DO

## 2018-11-11 ENCOUNTER — Encounter: Payer: Self-pay | Admitting: Neurology

## 2018-11-11 ENCOUNTER — Ambulatory Visit (INDEPENDENT_AMBULATORY_CARE_PROVIDER_SITE_OTHER): Payer: BC Managed Care – PPO | Admitting: Neurology

## 2018-11-11 ENCOUNTER — Other Ambulatory Visit: Payer: Self-pay

## 2018-11-11 VITALS — BP 115/73 | HR 81 | Temp 99.0°F | Ht 64.0 in | Wt 280.0 lb

## 2018-11-11 DIAGNOSIS — G43011 Migraine without aura, intractable, with status migrainosus: Secondary | ICD-10-CM | POA: Diagnosis not present

## 2018-11-11 MED ORDER — RIZATRIPTAN BENZOATE 10 MG PO TBDP: 10.0000 mg | ORAL_TABLET | ORAL | 3 refills | Status: DC | PRN

## 2018-11-11 MED ORDER — SUMATRIPTAN 20 MG/ACT NA SOLN: 20.0000 mg | NASAL | 3 refills | Status: DC | PRN

## 2018-11-11 MED ORDER — PREDNISONE 10 MG PO TABS: ORAL_TABLET | ORAL | 0 refills | Status: DC

## 2018-11-11 NOTE — Patient Instructions (Addendum)
1.  To break current headache, take prednisone taper:  Take 6tabs x1day, then 5tabs x1day, then 4tabs x1day, then 3tabs x1day, then 2tabs x1day, then 1tab x1day, then STOP 2.  Refilled nortriptyline 25mg  at bedtime 3.  Refilled rizatriptan and sumatriptan 4.  Limit use of pain relievers to no more than 2 days out of week to prevent risk of rebound or medication-overuse headache. 5.  Follow up in 4 months. [place] for virtual visit.

## 2019-03-21 ENCOUNTER — Ambulatory Visit
Admission: EM | Admit: 2019-03-21 | Discharge: 2019-03-21 | Disposition: A | Discharge status: Home / Self Care | Payer: Self-pay | Attending: Emergency Medicine | Admitting: Emergency Medicine

## 2019-03-21 ENCOUNTER — Other Ambulatory Visit: Payer: Self-pay

## 2019-03-21 ENCOUNTER — Encounter: Payer: Self-pay | Admitting: Emergency Medicine

## 2019-03-21 ENCOUNTER — Encounter: Payer: Self-pay | Admitting: Neurology

## 2019-03-21 DIAGNOSIS — G43011 Migraine without aura, intractable, with status migrainosus: Secondary | ICD-10-CM

## 2019-03-21 DIAGNOSIS — Z3202 Encounter for pregnancy test, result negative: Secondary | ICD-10-CM

## 2019-03-21 DIAGNOSIS — N926 Irregular menstruation, unspecified: Secondary | ICD-10-CM

## 2019-03-21 LAB — POCT URINE PREGNANCY: Preg Test, Ur: NEGATIVE

## 2019-03-21 MED ORDER — DEXAMETHASONE SODIUM PHOSPHATE 10 MG/ML IJ SOLN
10.0000 mg | Freq: Once | INTRAMUSCULAR | Status: AC
  Administered 2019-03-21: 10 mg via INTRAMUSCULAR

## 2019-03-21 MED ORDER — KETOROLAC TROMETHAMINE 60 MG/2ML IM SOLN
60.0000 mg | Freq: Once | INTRAMUSCULAR | Status: AC
  Administered 2019-03-21: 60 mg via INTRAMUSCULAR

## 2019-03-21 NOTE — Discharge Instructions (Addendum)
Important to log your.,  Follow-up with PCP for persistent irregular cycles. Important to follow-up with your headache provider and keep headache log in the interim. Go to ER for worse headache of life, sudden thunderclap headache, facial droop, difficulty speaking, chest pain, shortness of breath.

## 2019-03-21 NOTE — ED Triage Notes (Signed)
Pt presents to [facility] for assessment of chronic headaches.  Patient states she has been having headaches at night x 1.5 weeks, makes it difficult for her to sleep.  Headache currently in front of head and temple area.  Pt states she has not tried any medications for it.

## 2019-03-21 NOTE — ED Provider Notes (Signed)
[facility]    CSN: [id number] Arrival date & time: 03/21/19  1214      History   Chief Complaint Chief Complaint  Patient presents with  . Headache    HPI Tammy Randall is a 23 y.o. female with history of CP, asthma, migraine presenting for 1 week course of bilateral temporal headache.  Patient states that current level of pain and location is consistent with previous migraines.  States that "sometimes I go away, sometimes it".  Patient is followed by headache clinic: Has tried Maxalt in the past without significant relief.  Has not tried it for this headache.  Patient denies change in vision, weakness, worse headache of life, nausea, vomiting, photophobia.  Patient also noting that she is 1 week late for her.:  Currently sexually active, uses condoms, though not on any contraceptive.  Denies any genital lesions, vaginal pain, pelvic pain, vaginal discharge.   Past Medical History:  Diagnosis Date  . Asthma   . CP (cerebral palsy) (HCC)   . Migraine     Patient Active Problem List   Diagnosis Date Noted  . Dog bite 10/26/2017    History reviewed. No pertinent surgical history.  OB History   No obstetric history on file.      Home Medications    Prior to Admission medications   Medication Sig Start Date End Date Taking? Authorizing Provider  rizatriptan (MAXALT-MLT) 10 MG disintegrating tablet Take 1 tablet (10 mg total) by mouth as needed for migraine. May repeat in 2 hours if needed.  Maximum 2 tablets in 24 hours 11/11/18   Drema Dallas, DO  SUMAtriptan (IMITREX) 20 MG/ACT nasal spray Place 1 spray (20 mg total) into the nose every 2 (two) hours as needed for migraine or headache. May repeat in 2 hours if headache persists or recurs. 11/11/18   Drema Dallas, DO    Family History Family History  Problem Relation Age of Onset  . Diabetes Mother   . Hypertension Mother   . Diabetes Maternal Grandmother   . Hypertension Maternal Grandmother   .  High Cholesterol Maternal Grandmother   . CVA Maternal Grandfather     Social History Social History   Tobacco Use  . Smoking status: Never Smoker  . Smokeless tobacco: Never Used  Substance Use Topics  . Alcohol use: No  . Drug use: Yes    Types: Marijuana    Comment: a few times     Allergies   Patient has no known allergies.   Review of Systems Review of Systems  Constitutional: Negative for activity change, appetite change, fatigue and fever.  HENT: Negative for ear pain, sinus pain, sore throat and voice change.   Eyes: Negative for photophobia, pain, redness and visual disturbance.  Respiratory: Negative for cough and shortness of breath.   Cardiovascular: Negative for chest pain and palpitations.  Gastrointestinal: Negative for abdominal pain, diarrhea, nausea and vomiting.  Genitourinary: Positive for menstrual problem. Negative for pelvic pain.  Musculoskeletal: Negative for arthralgias and myalgias.  Skin: Negative for rash and wound.  Neurological: Positive for headaches. Negative for dizziness, tremors, seizures, syncope, facial asymmetry, speech difficulty, weakness, light-headedness and numbness.     Physical Exam Triage Vital Signs ED Triage Vitals  Enc Vitals Group     BP 03/21/19 1221 135/84     Pulse Rate 03/21/19 1221 83     Resp 03/21/19 1221 16     Temp 03/21/19 1221 98 F (36.7 C)  Temp Source 03/21/19 1221 Temporal     SpO2 03/21/19 1221 98 %     Weight --      Height --      Head Circumference --      Peak Flow --      Pain Score 03/21/19 1223 4     Pain Loc --      Pain Edu? --      Excl. in [place]? --    No data found.  Updated Vital Signs BP 135/84 (BP Location: Left Arm)   Pulse 83   Temp 98 F (36.7 C) (Temporal)   Resp 16   LMP 02/11/2019   SpO2 98%   Visual Acuity Right Eye Distance:   Left Eye Distance:   Bilateral Distance:    Right Eye Near:   Left Eye Near:    Bilateral Near:     Physical  Exam Constitutional:      General: She is not in acute distress.    Appearance: She is obese. She is not ill-appearing.  HENT:     Head: Normocephalic and atraumatic.     Right Ear: Tympanic membrane, ear canal and external ear normal.     Left Ear: Tympanic membrane, ear canal and external ear normal.     Mouth/Throat:     Mouth: Mucous membranes are moist.     Pharynx: Oropharynx is clear.  Eyes:     General: No scleral icterus.    Extraocular Movements: Extraocular movements intact.     Conjunctiva/sclera: Conjunctivae normal.     Pupils: Pupils are equal, round, and reactive to light.  Cardiovascular:     Rate and Rhythm: Normal rate.  Pulmonary:     Effort: Pulmonary effort is normal. No respiratory distress.     Breath sounds: No wheezing.  Abdominal:     General: Bowel sounds are normal.     Palpations: Abdomen is soft.     Tenderness: There is no abdominal tenderness.  Musculoskeletal:        General: No deformity. Normal range of motion.     Cervical back: Normal range of motion. No rigidity or tenderness.  Lymphadenopathy:     Cervical: No cervical adenopathy.  Skin:    Capillary Refill: Capillary refill takes less than 2 seconds.     Coloration: Skin is not cyanotic or jaundiced.     Findings: No bruising, erythema or rash.  Neurological:     Mental Status: She is alert.     Cranial Nerves: Cranial nerves are intact.     Sensory: Sensation is intact.     Motor: Motor function is intact.     Coordination: Coordination is intact.     Gait: Gait is intact.  Psychiatric:        Mood and Affect: Mood normal.        Behavior: Behavior normal.      UC Treatments / Results  Labs (all labs ordered are listed, but only abnormal results are displayed) Labs Reviewed  POCT URINE PREGNANCY - Normal    EKG   Radiology No results found.  Procedures Procedures (including critical care time)  Medications Ordered in UC Medications  ketorolac (TORADOL)  injection 60 mg (has no administration in time range)  dexamethasone (DECADRON) injection 10 mg (has no administration in time range)    Initial Impression / Assessment and Plan / UC Course  I have reviewed the triage vital signs and the nursing notes.  Pertinent labs & imaging  results that were available during my care of the patient were reviewed by me and considered in my medical decision making (see chart for details).      Patient afebrile, nontoxic and without neurocognitive deficit on exam.  Patient denies alarm symptoms to her as compared to previous migraines.  Due to late cycle, lack of OCP use, urine pregnancy obtained in office, reviewed by me: Negative.  Patient given IM Toradol, Decadron for migraine in office which he tolerated well.  Patient to continue headache log,.  Log, follow-up with headache doctor, and establish care with PCP.  Return precautions discussed, patient verbalized understanding and is agreeable to plan. Final Clinical Impressions(s) / UC Diagnoses   Final diagnoses:  Intractable migraine without aura and with status migrainosus  Menstrual period late     Discharge Instructions     Important to log your.,  Follow-up with PCP for persistent irregular cycles. Important to follow-up with your headache provider and keep headache log in the interim. Go to ER for worse headache of life, sudden thunderclap headache, facial droop, difficulty speaking, chest pain, shortness of breath.    ED Prescriptions    None     PDMP not reviewed this encounter.   Hall-Potvin, [place], [place] JerseyPA-C 03/21/19 1327

## 2019-03-21 NOTE — ED Notes (Signed)
Patient able to ambulate independently  

## 2019-04-04 ENCOUNTER — Ambulatory Visit: Payer: Self-pay | Admitting: Neurology

## 2019-06-21 ENCOUNTER — Encounter: Payer: Self-pay | Admitting: Internal Medicine

## 2019-06-21 ENCOUNTER — Ambulatory Visit (INDEPENDENT_AMBULATORY_CARE_PROVIDER_SITE_OTHER): Payer: Self-pay | Admitting: Internal Medicine

## 2019-06-21 DIAGNOSIS — G43809 Other migraine, not intractable, without status migrainosus: Secondary | ICD-10-CM

## 2019-06-21 DIAGNOSIS — Z7689 Persons encountering health services in other specified circumstances: Secondary | ICD-10-CM

## 2019-06-21 NOTE — Patient Instructions (Signed)
Thank you for choosing Primary Care at [facility] to be your medical home!    Tammy Randall was seen by De Hollingshead, DO today.   Tammy Randall's primary care provider is Marcy Siren, DO.   For the best care possible, you should try to see Marcy Siren, DO whenever you come to the clinic.   We look forward to seeing you again soon!  If you have any questions about your visit today, please call us at [phone] or feel free to reach your primary care provider via MyChart.

## 2019-06-21 NOTE — Progress Notes (Signed)
Virtual Visit via Telephone Note  I connected with Tammy Randall, on 06/21/2019 at 3:25 PM by telephone due to the COVID-19 pandemic and verified that I am speaking with the correct person using two identifiers.   Consent: I discussed the limitations, risks, security and privacy concerns of performing an evaluation and management service by telephone and the availability of in person appointments. I also discussed with the patient that there may be a patient responsible charge related to this service. The patient expressed understanding and agreed to proceed.   Location of Patient: Home   Location of Provider: Clinic    Persons participating in Telemedicine visit: Aliyha Fornes [facility] Dr. Earlene Plater      History of Present Illness: Patient has a visit to establish care. Patient has a history of migraines and CP. She is followed by neurology--has her next visit scheduled 4/16. She reports she was previously on triptan for abortive therapy, reports this really didn't help for her. Menstrual cycle worsens her migraine pattern. She has never been on medication that she takes daily for migraines. However, per chart review looks like she was previously on Nortriptyline.    Past Medical History:  Diagnosis Date  . Asthma   . CP (cerebral palsy) (HCC)   . Migraine    No Known Allergies  No current outpatient medications on file prior to visit.   No current facility-administered medications on file prior to visit.    Observations/Objective: NAD. Speaking clearly.  Work of breathing normal.  Alert and oriented. Mood appropriate.   Assessment and Plan: 1. Encounter to establish care  2. Other migraine without status migrainosus, not intractable Long standing history of migraines. Followed by neurology. Not currently on any medications.    Follow Up Instructions: Annual exam scheduled 4/15    I discussed the assessment and treatment plan with the patient. The  patient was provided an opportunity to ask questions and all were answered. The patient agreed with the plan and demonstrated an understanding of the instructions.   The patient was advised to call back or seek an in-person evaluation if the symptoms worsen or if the condition fails to improve as anticipated.     I provided 10 minutes total of non-face-to-face time during this encounter including median intraservice time, reviewing previous notes, investigations, ordering medications, medical decision making, coordinating care and patient verbalized understanding at the end of the visit.    Marcy Siren, D.O. Primary Care at [facility]  06/21/2019, 3:25 PM

## 2019-07-06 ENCOUNTER — Telehealth: Payer: Self-pay

## 2019-07-06 NOTE — Telephone Encounter (Signed)

## 2019-07-06 NOTE — Patient Instructions (Addendum)

## 2019-07-06 NOTE — Progress Notes (Deleted)
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  UPDATE: Last seen in August.  Refilled nortriptyline at that time.  ***.   She was seen in the ED in December for intractable migraine. Intensity:  *** Duration:  *** Frequency:  *** Current NSAIDS:None Current analgesics:Tylenol. Current triptans:Maxalt 10mg ; sumatriptan NS Current ergotamine:no Current anti-emetic:no Current muscle relaxants:no Current anti-anxiolytic:no Current sleep aide:no Current Antihypertensive medications:no Current Antidepressant medications:Nortriptyline 25 mg  Current Anticonvulsant medications:no Current anti-CGRP:no Current Vitamins/Herbal/Supplements:no Current Antihistamines/Decongestants:no Other therapy:no Other medication:none  Caffeine:no Alcohol:rarely Smoker:Marijuana once in awhile Diet:Water, juice, rarely soda Exercise:Trying to Depression:sometimes; Anxiety:yes Other pain:Ankle pain Sleep hygiene:poor. She hasn't been following the sleep hygiene recommendations.  HISTORY: Onset: Childhood Location:Left sided, back of head Quality:Pounding, stabbing/needles Initial intensity:Severe. Shedenies new headache, thunderclap headache Aura:no Prodrome:no Postdrome:fatigue Associated symptoms: Photophobia, phonophobia, blurred vision. Sometimes nausea.Shedenies associated unilateral numbness or weakness. Initial duration:2 to 3 weeks InitialFrequency:Typically every 2 to 3 months InitialFrequency of abortive medication:Rarely Triggers: Emotional stress. She says it doe snot seem to be related to her menstrual cycle. Relieving factors: Nothing Activity:Aggravates. 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  Past NSAIDS:Naproxen 500mg , indomethacin 50mg , Cambia, Sprix NS Past analgesics:Tramadol (variable efficacy), Tylenol Past abortive triptans:No Past abortive ergotamine:No Past muscle relaxants:baclofen Past anti-emetic:no Past antihypertensive medications:no Past antidepressant medications:no Past anticonvulsant medications:topiramate 100mg  twice daily, zonisamide Past anti-CGRP:no Past vitamins/Herbal/Supplements:none Past antihistamines/decongestants:none Other past therapies:none  Family history of headache:Mom  PAST MEDICAL HISTORY: Past Medical History:  Diagnosis Date  . Asthma   . CP (cerebral palsy) ([facility])   . Migraine     MEDICATIONS: No current outpatient medications on file prior to visit.   No current facility-administered medications on file prior to visit.    ALLERGIES: No Known Allergies  FAMILY HISTORY: Family History  Problem Relation Age of Onset  . Diabetes Mother   . Hypertension Mother   . Diabetes Maternal Grandmother   . Hypertension Maternal Grandmother   . High Cholesterol Maternal Grandmother   . CVA Maternal Grandfather    ***.  SOCIAL HISTORY: Social History   Socioeconomic History  . Marital status: Single    Spouse name: Not on file  . Number of children: Not on file  . Years of education: Not on file  . Highest education level: 12th grade  Occupational History    Employer: [employer]  Tobacco Use  . Smoking status: Never Smoker  . Smokeless tobacco: Never Used  Substance and Sexual Activity  . Alcohol use: No  . Drug use: Yes    Types: Marijuana    Comment: a few times  . Sexual activity: Yes    Birth control/protection: Condom  Other Topics Concern  . Not on file  Social History Narrative   Patient is left-handed. She lives with her boyfriend in a 1 story house. She rarely drinks caffeine. She is active at work.    Social Determinants of Health   Financial Resource Strain:   . Difficulty of Paying Living Expenses:   Food Insecurity:   . Worried About Charity fundraiser in the Last Year:   . Arboriculturist in the Last Year:   Transportation Needs:   . Film/video editor (Medical):   Marland Kitchen Lack of Transportation (Non-Medical):   Physical Activity:   .  Days of Exercise per Week:   . Minutes of Exercise per Session:   Stress:   . Feeling of Stress :   Social Connections:   . Frequency of Communication with Friends and Family:   . Frequency of Social Gatherings with Friends and Family:   . Attends Religious Services:   . Active Member of Clubs or Organizations:   . Attends Banker Meetings:   Marland Kitchen Marital Status:   Intimate Partner Violence:   . Fear of Current or Ex-Partner:   . Emotionally Abused:   Marland Kitchen Physically Abused:   . Sexually Abused:     REVIEW OF SYSTEMS: Constitutional: No fevers, chills, or sweats, no generalized fatigue, change in appetite Eyes: No visual changes, double vision, eye pain Ear, nose and throat: No hearing loss, ear pain, nasal congestion, sore throat Cardiovascular: No chest pain, palpitations Respiratory:  No shortness of breath at rest or with exertion, wheezes GastrointestinaI: No nausea, vomiting, diarrhea, abdominal pain, fecal incontinence Genitourinary:  No dysuria, urinary retention or frequency Musculoskeletal:  No neck pain, back pain Integumentary: No rash, pruritus, skin lesions Neurological: as above Psychiatric: No depression, insomnia, anxiety Endocrine: No palpitations, fatigue, diaphoresis, mood swings, change in appetite, change in weight, increased thirst Hematologic/Lymphatic:  No purpura, petechiae. Allergic/Immunologic: no itchy/runny eyes, nasal congestion, recent allergic reactions, rashes  PHYSICAL EXAM: *** General: No acute distress.  Patient appears ***-groomed.   Head:  Normocephalic/atraumatic Eyes:  Fundi  examined but not visualized Neck: supple, no paraspinal tenderness, full range of motion Heart:  Regular rate and rhythm Lungs:  Clear to auscultation bilaterally Back: No paraspinal tenderness Neurological Exam: alert and oriented to person, place, and time. Attention span and concentration intact, recent and remote memory intact, fund of knowledge intact.  Speech fluent and not dysarthric, language intact.  CN II-XII intact. Bulk and tone normal, muscle strength 5/5 throughout.  Sensation to light touch, temperature and vibration intact.  Deep tendon reflexes 2+ throughout, toes downgoing.  Finger to nose and heel to shin testing intact.  Gait normal, Romberg negative.  IMPRESSION: ***  PLAN: ***  Shon Millet, DO  CC: ***

## 2019-07-07 ENCOUNTER — Other Ambulatory Visit: Payer: Self-pay

## 2019-07-07 ENCOUNTER — Other Ambulatory Visit (HOSPITAL_COMMUNITY)
Admission: RE | Admit: 2019-07-07 | Discharge: 2019-07-07 | Disposition: A | Discharge status: Home / Self Care | Payer: Self-pay | Source: Ambulatory Visit | Attending: Internal Medicine | Admitting: Internal Medicine

## 2019-07-07 ENCOUNTER — Encounter: Payer: Self-pay | Admitting: Internal Medicine

## 2019-07-07 ENCOUNTER — Ambulatory Visit (INDEPENDENT_AMBULATORY_CARE_PROVIDER_SITE_OTHER): Payer: Self-pay | Admitting: Internal Medicine

## 2019-07-07 VITALS — BP 114/79 | HR 68 | Temp 97.3°F | Resp 17 | Ht 64.0 in | Wt 283.0 lb

## 2019-07-07 DIAGNOSIS — Z124 Encounter for screening for malignant neoplasm of cervix: Secondary | ICD-10-CM | POA: Insufficient documentation

## 2019-07-07 DIAGNOSIS — Z0000 Encounter for general adult medical examination without abnormal findings: Secondary | ICD-10-CM

## 2019-07-07 DIAGNOSIS — Z113 Encounter for screening for infections with a predominantly sexual mode of transmission: Secondary | ICD-10-CM

## 2019-07-07 NOTE — Progress Notes (Signed)
  Subjective:    Tammy Randall - 24 y.o. female MRN [id number]  Date of birth: December 28, 1995  HPI  Tammy Randall is here for annual exam. No concerns. She would like STD testing. She is asymptomatic but reports h/o Gonorrhea.      Health Maintenance:  Health Maintenance Due  Topic Date Due  . CHLAMYDIA SCREENING  Never done  . HIV Screening  Never done  . TETANUS/TDAP  Never done  . PAP-Cervical Cytology Screening  Never done  . PAP SMEAR-Modifier  Never done    -  reports that she has never smoked. She has never used smokeless tobacco. - Review of Systems: Per HPI. - Past Medical History: There are no problems to display for this patient.  - Medications: reviewed and updated   Objective:   Physical Exam There were no vitals taken for this visit. Physical Exam  Constitutional: She is oriented to person, place, and time and well-developed, well-nourished, and in no distress.  HENT:  Head: Normocephalic and atraumatic.  Mouth/Throat: Oropharynx is clear and moist.  Eyes: Pupils are equal, round, and reactive to light. Conjunctivae and EOM are normal.  Neck: No thyromegaly present.  Cardiovascular: Normal rate, regular rhythm, normal heart sounds and intact distal pulses.  No murmur heard. Pulmonary/Chest: Effort normal and breath sounds normal. No respiratory distress. She has no wheezes.  Abdominal: Soft. Bowel sounds are normal. She exhibits no distension. There is no abdominal tenderness. There is no rebound and no guarding.  Genitourinary:    Genitourinary Comments: GU/GYN: Exam performed in the presence of a chaperone. External genitalia within normal limits.  Vaginal mucosa pink, moist, normal rugae.  Nonfriable cervix without lesions, no discharge or bleeding noted on speculum exam.  Bimanual exam revealed normal, nongravid uterus.  No cervical motion tenderness. No adnexal masses bilaterally.      Musculoskeletal:        General: No deformity or edema. Normal  range of motion.     Cervical back: Normal range of motion and neck supple.  Lymphadenopathy:    She has no cervical adenopathy.  Neurological: She is alert and oriented to person, place, and time. Gait normal.  Skin: Skin is warm and dry. No rash noted. She is not diaphoretic.  Psychiatric: Mood, affect and judgment normal.           Assessment & Plan:    1. Annual physical exam Counseled on 150 minutes of exercise per week, healthy eating (including decreased daily intake of saturated fats, cholesterol, added sugars, sodium), STI prevention, routine healthcare maintenance. - CBC with Differential - Comprehensive metabolic panel  2. Pap smear for cervical cancer screening - Cytology - PAP([facility])  3. Screening for STDs (sexually transmitted diseases) - HIV antibody (with reflex) - Cervicovaginal ancillary only - RPR     Marcy Siren, D.O. 07/07/2019, 9:40 AM Primary Care at [facility]

## 2019-07-08 ENCOUNTER — Ambulatory Visit: Payer: Self-pay | Admitting: Neurology

## 2019-07-08 LAB — COMPREHENSIVE METABOLIC PANEL
ALT: 8 IU/L (ref 0–32)
AST: 13 IU/L (ref 0–40)
Albumin/Globulin Ratio: 1.7 (ref 1.2–2.2)
Albumin: 4.2 g/dL (ref 3.9–5.0)
Alkaline Phosphatase: 66 IU/L (ref 39–117)
BUN/Creatinine Ratio: 11 (ref 9–23)
BUN: 6 mg/dL (ref 6–20)
Bilirubin Total: 0.3 mg/dL (ref 0.0–1.2)
CO2: 21 mmol/L (ref 20–29)
Calcium: 8.9 mg/dL (ref 8.7–10.2)
Chloride: 107 mmol/L — ABNORMAL HIGH (ref 96–106)
Creatinine, Ser: 0.53 mg/dL — ABNORMAL LOW (ref 0.57–1.00)
GFR calc Af Amer: 155 mL/min/{1.73_m2} (ref >59)
GFR calc non Af Amer: 134 mL/min/{1.73_m2} (ref >59)
Globulin, Total: 2.5 g/dL (ref 1.5–4.5)
Glucose: 79 mg/dL (ref 65–99)
Potassium: 4.3 mmol/L (ref 3.5–5.2)
Sodium: 141 mmol/L (ref 134–144)
Total Protein: 6.7 g/dL (ref 6.0–8.5)

## 2019-07-08 LAB — CERVICOVAGINAL ANCILLARY ONLY
Bacterial Vaginitis (gardnerella): POSITIVE — AB
Candida Glabrata: NEGATIVE
Candida Vaginitis: NEGATIVE
Chlamydia: NEGATIVE
Comment: NEGATIVE
Comment: NEGATIVE
Comment: NEGATIVE
Comment: NEGATIVE
Comment: NEGATIVE
Comment: NORMAL
Neisseria Gonorrhea: NEGATIVE
Trichomonas: NEGATIVE

## 2019-07-08 LAB — CBC WITH DIFFERENTIAL/PLATELET
Basophils Absolute: 0 10*3/uL (ref 0.0–0.2)
Basos: 1 %
EOS (ABSOLUTE): 0.2 10*3/uL (ref 0.0–0.4)
Eos: 2 %
Hematocrit: 41.6 % (ref 34.0–46.6)
Hemoglobin: 13.2 g/dL (ref 11.1–15.9)
Immature Grans (Abs): 0 10*3/uL (ref 0.0–0.1)
Immature Granulocytes: 0 %
Lymphocytes Absolute: 3.2 10*3/uL — ABNORMAL HIGH (ref 0.7–3.1)
Lymphs: 39 %
MCH: 27.6 pg (ref 26.6–33.0)
MCHC: 31.7 g/dL (ref 31.5–35.7)
MCV: 87 fL (ref 79–97)
Monocytes Absolute: 0.6 10*3/uL (ref 0.1–0.9)
Monocytes: 7 %
Neutrophils Absolute: 4.2 10*3/uL (ref 1.4–7.0)
Neutrophils: 51 %
Platelets: 260 10*3/uL (ref 150–450)
RBC: 4.79 x10E6/uL (ref 3.77–5.28)
RDW: 13.7 % (ref 11.7–15.4)
WBC: 8.2 10*3/uL (ref 3.4–10.8)

## 2019-07-08 LAB — RPR: RPR Ser Ql: NONREACTIVE

## 2019-07-08 LAB — HIV ANTIBODY (ROUTINE TESTING W REFLEX): HIV Screen 4th Generation wRfx: NONREACTIVE

## 2019-07-13 ENCOUNTER — Other Ambulatory Visit: Payer: Self-pay | Admitting: Internal Medicine

## 2019-07-13 MED ORDER — METRONIDAZOLE 500 MG PO TABS: 500.0000 mg | ORAL_TABLET | Freq: Two times a day (BID) | ORAL | 0 refills | Status: DC

## 2019-07-15 NOTE — Progress Notes (Signed)
Patient notified of results & recommendations. Expressed understanding.

## 2019-07-21 LAB — CYTOLOGY - PAP
Adequacy: ABSENT
Diagnosis: NEGATIVE

## 2019-07-21 NOTE — Progress Notes (Signed)
Normal pap letter mailed

## 2019-07-29 ENCOUNTER — Encounter: Payer: Self-pay | Admitting: Internal Medicine

## 2019-07-29 ENCOUNTER — Telehealth (INDEPENDENT_AMBULATORY_CARE_PROVIDER_SITE_OTHER): Payer: Self-pay | Admitting: Internal Medicine

## 2019-07-29 DIAGNOSIS — Z30011 Encounter for initial prescription of contraceptive pills: Secondary | ICD-10-CM

## 2019-07-29 MED ORDER — NORETHINDRONE 0.35 MG PO TABS: 1.0000 | ORAL_TABLET | Freq: Every day | ORAL | 11 refills | Status: DC

## 2019-07-29 NOTE — Progress Notes (Signed)
Virtual Visit via Telephone Note  I connected with Tammy Randall, on 07/29/2019 at 9:14 AM by telephone due to the COVID-19 pandemic and verified that I am speaking with the correct person using two identifiers.   Consent: I discussed the limitations, risks, security and privacy concerns of performing an evaluation and management service by telephone and the availability of in person appointments. I also discussed with the patient that there may be a patient responsible charge related to this service. The patient expressed understanding and agreed to proceed.   Location of Patient: Home   Location of Provider: Clinic    Persons participating in Telemedicine visit: Shaqueta Casady [facility] Dr. Earlene Plater    History of Present Illness: Patient has a visit to discuss contraception. LMP 5/1. Believes that she was previously on an OCP. N side effects when taking in the past. Does have a history of migraines with aura. Has prodromal like symptoms and vision changes that are severe preceding her migraines.    Past Medical History:  Diagnosis Date  . Asthma   . CP (cerebral palsy) ([facility])   . Migraine    No Known Allergies  No current outpatient medications on file prior to visit.   No current facility-administered medications on file prior to visit.    Observations/Objective: NAD. Speaking clearly.  Work of breathing normal.  Alert and oriented. Mood appropriate.   Assessment and Plan: 1. Encounter for initial prescription of contraceptive pills Discussed that she is not a good candidate for OCPs due to risk of CVA with history of migraines with aura. She is not interested in LARC. Discussed POP and need to take at same time daily for pregnancy prevention and to prevent breakthrough bleeding. Since LMP onset was >5 days ago, discussed back up method of contraception for 7 days after start of pill.  - norethindrone (ORTHO MICRONOR) 0.35 MG tablet; Take 1 tablet (0.35 mg  total) by mouth daily.  Dispense: 1 Package; Refill: 11  Follow Up Instructions: Annual exam and PRN    I discussed the assessment and treatment plan with the patient. The patient was provided an opportunity to ask questions and all were answered. The patient agreed with the plan and demonstrated an understanding of the instructions.   The patient was advised to call back or seek an in-person evaluation if the symptoms worsen or if the condition fails to improve as anticipated.     I provided 12 minutes total of non-face-to-face time during this encounter including median intraservice time, reviewing previous notes, investigations, ordering medications, medical decision making, coordinating care and patient verbalized understanding at the end of the visit.    Marcy Siren, D.O. Primary Care at [facility]  07/29/2019, 9:14 AM

## 2019-12-10 ENCOUNTER — Ambulatory Visit
Admission: EM | Admit: 2019-12-10 | Discharge: 2019-12-10 | Disposition: A | Discharge status: Home / Self Care | Payer: BC Managed Care – PPO | Attending: Physician Assistant | Admitting: Physician Assistant

## 2019-12-10 ENCOUNTER — Encounter: Payer: Self-pay | Admitting: *Deleted

## 2019-12-10 ENCOUNTER — Other Ambulatory Visit: Payer: Self-pay

## 2019-12-10 DIAGNOSIS — R059 Cough, unspecified: Secondary | ICD-10-CM

## 2019-12-10 DIAGNOSIS — Z1152 Encounter for screening for COVID-19: Secondary | ICD-10-CM | POA: Diagnosis not present

## 2019-12-10 DIAGNOSIS — R6883 Chills (without fever): Secondary | ICD-10-CM

## 2019-12-10 DIAGNOSIS — J029 Acute pharyngitis, unspecified: Secondary | ICD-10-CM

## 2019-12-10 DIAGNOSIS — R52 Pain, unspecified: Secondary | ICD-10-CM

## 2019-12-10 NOTE — ED Provider Notes (Signed)
[facility]    CSN: [id number] Arrival date & time: 12/10/19  0160      History   Chief Complaint Chief Complaint  Patient presents with  . Headache    chills, diarrhea, sore throat    HPI Tammy Randall is a 24 y.o. female.   24 year old female comes in for 3 day of URI symptoms. Sore throat, body aches, chills, cough. No rhinorrhea, nasal congestion. Has had subjective fever. Has had a few episode of diarrhea. Denies abdominal pain, nausea, vomiting. Denies shortness of breath, loss of taste/smell. Mother with possible COVID.      Past Medical History:  Diagnosis Date  . Asthma   . CP (cerebral palsy) ([facility])   . Migraine     There are no problems to display for this patient.   History reviewed. No pertinent surgical history.  OB History   No obstetric history on file.      Home Medications    Prior to Admission medications   Medication Sig Start Date End Date Taking? Authorizing Provider  norethindrone (ORTHO MICRONOR) 0.35 MG tablet Take 1 tablet (0.35 mg total) by mouth daily. 07/29/19 12/10/19  Arvilla Market, DO    Family History Family History  Problem Relation Age of Onset  . Diabetes Mother   . Hypertension Mother   . Diabetes Maternal Grandmother   . Hypertension Maternal Grandmother   . High Cholesterol Maternal Grandmother   . CVA Maternal Grandfather     Social History Social History   Tobacco Use  . Smoking status: Never Smoker  . Smokeless tobacco: Never Used  Substance Use Topics  . Alcohol use: No  . Drug use: Yes    Types: Marijuana    Comment: a few times     Allergies   Patient has no known allergies.   Review of Systems Review of Systems  Reason unable to perform ROS: See HPI as above.     Physical Exam Triage Vital Signs ED Triage Vitals  Enc Vitals Group     BP 12/10/19 1032 (!) 150/80     Pulse Rate 12/10/19 1032 (!) 101     Resp 12/10/19 1032 14     Temp 12/10/19 1032 99.7 F  (37.6 C)     Temp Source 12/10/19 1032 Oral     SpO2 12/10/19 1032 98 %     Weight --      Height --      Head Circumference --      Peak Flow --      Pain Score 12/10/19 1034 7     Pain Loc --      Pain Edu? --      Excl. in GC? --    No data found.  Updated Vital Signs BP (!) 150/80 (BP Location: Left Arm)   Pulse (!) 101   Temp 99.7 F (37.6 C) (Oral)   Resp 14   LMP 10/23/2019 (Approximate)   SpO2 98%   Physical Exam Constitutional:      General: She is not in acute distress.    Appearance: Normal appearance. She is not ill-appearing, toxic-appearing or diaphoretic.  HENT:     Head: Normocephalic and atraumatic.     Nose:     Right Sinus: Maxillary sinus tenderness and frontal sinus tenderness present.     Left Sinus: Maxillary sinus tenderness and frontal sinus tenderness present.     Mouth/Throat:     Mouth: Mucous membranes are moist.  Pharynx: Oropharynx is clear. Uvula midline.  Cardiovascular:     Rate and Rhythm: Normal rate and regular rhythm.     Heart sounds: Normal heart sounds. No murmur heard.  No friction rub. No gallop.   Pulmonary:     Effort: Pulmonary effort is normal. No accessory muscle usage, prolonged expiration, respiratory distress or retractions.     Comments: Lungs clear to auscultation without adventitious lung sounds. Musculoskeletal:     Cervical back: Normal range of motion and neck supple.  Neurological:     General: No focal deficit present.     Mental Status: She is alert and oriented to person, place, and time.      UC Treatments / Results  Labs (all labs ordered are listed, but only abnormal results are displayed) Labs Reviewed  NOVEL CORONAVIRUS, NAA    EKG   Radiology No results found.  Procedures Procedures (including critical care time)  Medications Ordered in UC Medications - No data to display  Initial Impression / Assessment and Plan / UC Course  I have reviewed the triage vital signs and the  nursing notes.  Pertinent labs & imaging results that were available during my care of the patient were reviewed by me and considered in my medical decision making (see chart for details).    COVID PCR test ordered. Patient to quarantine until testing results return. No alarming signs on exam. LCTAB. Symptomatic treatment discussed.  Push fluids.  Return precautions given.  Patient expresses understanding and agrees to plan.   Final Clinical Impressions(s) / UC Diagnoses   Final diagnoses:  Encounter for screening for COVID-19  Cough  Sore throat  Body aches  Chills    ED Prescriptions    None     PDMP not reviewed this encounter.   Belinda Fisher, PA-C 12/10/19 1117

## 2019-12-10 NOTE — ED Triage Notes (Signed)
Pt stated--body ache, sore, chills  throat x 1

## 2019-12-10 NOTE — Discharge Instructions (Signed)
COVID PCR testing ordered. I would like you to quarantine until testing results. You can take over the counter flonase/nasacort to help with nasal congestion/drainage. Tylenol/motrin for pain and fever. Keep hydrated, urine should be clear to pale yellow in color. If experiencing shortness of breath, trouble breathing, go to the emergency department for further evaluation needed.   For sore throat/cough try using a honey-based tea. Use 3 teaspoons of honey with juice squeezed from half lemon. Place shaved pieces of ginger into 1/2-1 cup of water and warm over stove top. Then mix the ingredients and repeat every 4 hours as needed. 

## 2019-12-12 LAB — SARS-COV-2, NAA 2 DAY TAT

## 2019-12-12 LAB — NOVEL CORONAVIRUS, NAA: SARS-CoV-2, NAA: DETECTED — AB

## 2019-12-13 ENCOUNTER — Other Ambulatory Visit: Payer: Self-pay | Admitting: Nurse Practitioner

## 2019-12-13 ENCOUNTER — Encounter: Payer: Self-pay | Admitting: Nurse Practitioner

## 2019-12-13 DIAGNOSIS — U071 COVID-19: Secondary | ICD-10-CM

## 2019-12-13 DIAGNOSIS — G809 Cerebral palsy, unspecified: Secondary | ICD-10-CM

## 2019-12-13 DIAGNOSIS — J45909 Unspecified asthma, uncomplicated: Secondary | ICD-10-CM

## 2019-12-13 NOTE — Progress Notes (Signed)
I connected by phone with Tammy Randall on 12/13/2019 at 10:17 AM to discuss Tammy potential use of a new treatment for mild to moderate COVID-19 viral infection in non-hospitalized patients.  This Randall is a 24 y.o. female that meets Tammy FDA criteria for Emergency Use Authorization of COVID monoclonal antibody casirivimab/imdevimab.  Has a (+) direct SARS-CoV-2 viral test result  Has mild or moderate COVID-19   Is NOT hospitalized due to COVID-19  Is within 10 days of symptom onset  Has at least one of Tammy high risk factor(s) for progression to severe COVID-19 and/or hospitalization as defined in EUA.  Specific high risk criteria : BMI > 25, Chronic Lung Disease, Neurodevelopmental disorder and Other high risk medical condition per CDC:  Latina   I have spoken and communicated Tammy following to Tammy Randall or parent/caregiver regarding COVID monoclonal antibody treatment:  1. FDA has authorized Tammy emergency use for Tammy treatment of mild to moderate COVID-19 in adults and pediatric patients with positive results of direct SARS-CoV-2 viral testing who are 62 years of age and older weighing at least 40 kg, and who are at high risk for progressing to severe COVID-19 and/or hospitalization.  2. Tammy significant known and potential risks and benefits of COVID monoclonal antibody, and Tammy extent to which such potential risks and benefits are unknown.  3. Information on available alternative treatments and Tammy risks and benefits of those alternatives, including clinical trials.  4. Patients treated with COVID monoclonal antibody should continue to self-isolate and use infection control measures (e.g., wear mask, isolate, social distance, avoid sharing personal items, clean and disinfect "high touch" surfaces, and frequent handwashing) according to CDC guidelines.   5. Tammy Randall or parent/caregiver has Tammy option to accept or refuse COVID monoclonal antibody treatment.  After reviewing this  information with Tammy Randall, Tammy Randall agreed to proceed with receiving casirivimab\imdevimab infusion and will be provided a copy of Tammy Fact sheet prior to receiving Tammy infusion. Tollie Eth 12/13/2019 10:17 AM

## 2020-01-12 ENCOUNTER — Ambulatory Visit: Payer: Self-pay | Admitting: Internal Medicine

## 2020-02-13 ENCOUNTER — Ambulatory Visit: Payer: Self-pay | Admitting: Internal Medicine

## 2020-02-29 NOTE — Patient Instructions (Signed)

## 2020-03-01 ENCOUNTER — Other Ambulatory Visit (HOSPITAL_COMMUNITY)
Admission: RE | Admit: 2020-03-01 | Discharge: 2020-03-01 | Disposition: A | Discharge status: Home / Self Care | Payer: BC Managed Care – PPO | Source: Ambulatory Visit | Attending: Internal Medicine | Admitting: Internal Medicine

## 2020-03-01 ENCOUNTER — Other Ambulatory Visit: Payer: Self-pay

## 2020-03-01 ENCOUNTER — Encounter: Payer: Self-pay | Admitting: Internal Medicine

## 2020-03-01 ENCOUNTER — Ambulatory Visit (INDEPENDENT_AMBULATORY_CARE_PROVIDER_SITE_OTHER): Payer: BC Managed Care – PPO | Admitting: Internal Medicine

## 2020-03-01 VITALS — BP 109/70 | HR 57 | Temp 97.3°F | Resp 17 | Ht 65.0 in | Wt 272.0 lb

## 2020-03-01 DIAGNOSIS — B9689 Other specified bacterial agents as the cause of diseases classified elsewhere: Secondary | ICD-10-CM | POA: Insufficient documentation

## 2020-03-01 DIAGNOSIS — B373 Candidiasis of vulva and vagina: Secondary | ICD-10-CM | POA: Insufficient documentation

## 2020-03-01 DIAGNOSIS — Z113 Encounter for screening for infections with a predominantly sexual mode of transmission: Secondary | ICD-10-CM | POA: Diagnosis not present

## 2020-03-01 DIAGNOSIS — N760 Acute vaginitis: Secondary | ICD-10-CM | POA: Diagnosis not present

## 2020-03-01 DIAGNOSIS — Z1159 Encounter for screening for other viral diseases: Secondary | ICD-10-CM

## 2020-03-01 DIAGNOSIS — Z124 Encounter for screening for malignant neoplasm of cervix: Secondary | ICD-10-CM

## 2020-03-01 NOTE — Progress Notes (Signed)
  Subjective:    Tammy Randall - 24 y.o. female MRN [id number]  Date of birth: 07-02-1995  HPI  Tammy Randall is here for STD screening. Has no symptoms or specific concerns. Would just like to be tested.      Health Maintenance:  Health Maintenance Due  Topic Date Due  . Hepatitis C Screening  Never done  . COVID-19 Vaccine (1) Never done  . INFLUENZA VACCINE  Never done    -  reports that she has never smoked. She has never used smokeless tobacco. - Review of Systems: Per HPI. - Past Medical History: There are no problems to display for this patient.  - Medications: reviewed and updated   Objective:   Physical Exam BP 109/70   Pulse (!) 57   Temp (!) 97.3 F (36.3 C) (Temporal)   Resp 17   Ht 5\' 5"  (1.651 m)   Wt 272 lb (123.4 kg)   LMP 02/10/2020 (Exact Date)   SpO2 97%   BMI 45.26 kg/m  Physical Exam Constitutional:      General: She is not in acute distress.    Appearance: She is not diaphoretic.  Cardiovascular:     Rate and Rhythm: Normal rate.  Pulmonary:     Effort: Pulmonary effort is normal. No respiratory distress.  Musculoskeletal:        General: Normal range of motion.  Skin:    General: Skin is warm and dry.  Neurological:     Mental Status: She is alert and oriented to person, place, and time.  Psychiatric:        Mood and Affect: Affect normal.        Judgment: Judgment normal.            Assessment & Plan:   1. Screening for STDs (sexually transmitted diseases) - Cervicovaginal ancillary only - RPR - HIV antibody (with reflex) - HCV Ab w/Rflx to Verification  2. Need for hepatitis C screening test - HCV Ab w/Rflx to Verification   02/12/2020, D.O. 03/01/2020, 10:08 AM Primary Care at [facility]

## 2020-03-02 LAB — CERVICOVAGINAL ANCILLARY ONLY
Bacterial Vaginitis (gardnerella): POSITIVE — AB
Candida Glabrata: NEGATIVE
Candida Vaginitis: POSITIVE — AB
Chlamydia: NEGATIVE
Comment: NEGATIVE
Comment: NEGATIVE
Comment: NEGATIVE
Comment: NEGATIVE
Comment: NEGATIVE
Comment: NORMAL
Neisseria Gonorrhea: NEGATIVE
Trichomonas: NEGATIVE

## 2020-03-02 LAB — HCV INTERPRETATION

## 2020-03-02 LAB — RPR: RPR Ser Ql: NONREACTIVE

## 2020-03-02 LAB — HCV AB W/RFLX TO VERIFICATION: HCV Ab: <0.1 s/co ratio (ref 0.0–0.9)

## 2020-03-02 LAB — HIV ANTIBODY (ROUTINE TESTING W REFLEX): HIV Screen 4th Generation wRfx: NONREACTIVE

## 2020-03-06 ENCOUNTER — Other Ambulatory Visit: Payer: Self-pay | Admitting: Internal Medicine

## 2020-03-06 MED ORDER — FLUCONAZOLE 150 MG PO TABS: ORAL_TABLET | ORAL | 0 refills | Status: DC

## 2020-03-06 MED ORDER — METRONIDAZOLE 500 MG PO TABS: 500.0000 mg | ORAL_TABLET | Freq: Two times a day (BID) | ORAL | 0 refills | Status: DC

## 2020-04-19 ENCOUNTER — Telehealth (INDEPENDENT_AMBULATORY_CARE_PROVIDER_SITE_OTHER): Payer: BC Managed Care – PPO | Admitting: Physician Assistant

## 2020-04-19 ENCOUNTER — Encounter: Payer: Self-pay | Admitting: Physician Assistant

## 2020-04-19 DIAGNOSIS — B372 Candidiasis of skin and nail: Secondary | ICD-10-CM | POA: Diagnosis not present

## 2020-04-19 MED ORDER — NYSTATIN 100000 UNIT/GM EX CREA
1.0000 | TOPICAL_CREAM | Freq: Two times a day (BID) | CUTANEOUS | 1 refills | Status: DC
Start: 2020-04-19 — End: 2020-12-14

## 2020-04-19 NOTE — Progress Notes (Signed)
I connected with  Tammy Randall on 04/19/20 by a video enabled telemedicine application and verified that I am speaking with the correct person using two identifiers.   I discussed the limitations of evaluation and management by telemedicine. The patient expressed understanding and agreed to proceed.    Established Patient Office Visit  Subjective:  Patient ID: Tammy Randall, female    DOB: Aug 03, 1995  Age: 25 y.o. MRN: [id number]  CC: No chief complaint on file.  Virtual Visit via Video Note  I connected with Tammy Randall on 04/19/20 at  4:10 PM EST by a video enabled telemedicine application and verified that I am speaking with the correct person using two identifiers.  Location: Patient: Home Provider: Primary Care at [facility]   I discussed the limitations of evaluation and management by telemedicine and the availability of in person appointments. The patient expressed understanding and agreed to proceed.  History of Present Illness:  Tammy Randall reports that she started having a rash under both breasts for the past week, has not tried anything for relief.  Denies any new detergents, body lotions, fragrances, medications, body washes.  Describes it as scaly, itchy.    Observations/Objective:  Medical history and current medications reviewed, no physical exam completed     Past Medical History:  Diagnosis Date  . Asthma   . CP (cerebral palsy) (HCC)   . Migraine     History reviewed. No pertinent surgical history.  Family History  Problem Relation Age of Onset  . Diabetes Mother   . Hypertension Mother   . Diabetes Maternal Grandmother   . Hypertension Maternal Grandmother   . High Cholesterol Maternal Grandmother   . CVA Maternal Grandfather     Social History   Socioeconomic History  . Marital status: Single    Spouse name: Not on file  . Number of children: Not on file  . Years of education: Not on file  . Highest education level: 12th  grade  Occupational History    Employer: [employer]  Tobacco Use  . Smoking status: Never Smoker  . Smokeless tobacco: Never Used  Substance and Sexual Activity  . Alcohol use: No  . Drug use: Yes    Types: Marijuana    Comment: a few times  . Sexual activity: Yes    Birth control/protection: Condom  Other Topics Concern  . Not on file  Social History Narrative   Patient is left-handed. She lives with her boyfriend in a 1 story house. She rarely drinks caffeine. She is active at work.   Social Determinants of Health   Financial Resource Strain: Not on file  Food Insecurity: Not on file  Transportation Needs: Not on file  Physical Activity: Not on file  Stress: Not on file  Social Connections: Not on file  Intimate Partner Violence: Not on file    Outpatient Medications Prior to Visit  Medication Sig Dispense Refill  . fluconazole (DIFLUCAN) 150 MG tablet Take one tablet now. Take second tablet in 72 hours if still feeling symptoms. 2 tablet 0  . metroNIDAZOLE (FLAGYL) 500 MG tablet Take 1 tablet (500 mg total) by mouth 2 (two) times daily. 14 tablet 0   No facility-administered medications prior to visit.    No Known Allergies  ROS Review of Systems  Constitutional: Negative.   HENT: Negative.   Eyes: Negative.   Respiratory: Negative.   Cardiovascular: Negative.   Gastrointestinal: Negative.   Endocrine: Negative.   Genitourinary: Negative.   Musculoskeletal:  Negative.   Skin: Positive for rash.  Allergic/Immunologic: Negative.   Neurological: Negative.   Hematological: Negative.   Psychiatric/Behavioral: Negative.       Objective:     There were no vitals taken for this visit. Wt Readings from Last 3 Encounters:  03/01/20 272 lb (123.4 kg)  07/07/19 283 lb (128.4 kg)  11/11/18 280 lb (127 kg)     Health Maintenance Due  Topic Date Due  . COVID-19 Vaccine (1) Never done  . INFLUENZA VACCINE  Never done    There are no preventive care  reminders to display for this patient.  No results found for: TSH Lab Results  Component Value Date   WBC 8.2 07/07/2019   HGB 13.2 07/07/2019   HCT 41.6 07/07/2019   MCV 87 07/07/2019   PLT 260 07/07/2019   Lab Results  Component Value Date   NA 141 07/07/2019   K 4.3 07/07/2019   CO2 21 07/07/2019   GLUCOSE 79 07/07/2019   BUN 6 07/07/2019   CREATININE 0.53 (L) 07/07/2019   BILITOT 0.3 07/07/2019   ALKPHOS 66 07/07/2019   AST 13 07/07/2019   ALT 8 07/07/2019   PROT 6.7 07/07/2019   ALBUMIN 4.2 07/07/2019   CALCIUM 8.9 07/07/2019   ANIONGAP 7 08/29/2018   No results found for: CHOL No results found for: HDL No results found for: LDLCALC No results found for: TRIG No results found for: CHOLHDL No results found for: JSEG3T    Assessment & Plan:   Problem List Items Addressed This Visit      Musculoskeletal and Integument   Yeast infection of the skin - Primary   Relevant Medications   nystatin cream (MYCOSTATIN)     Assessment and Plan:  1. Yeast infection of the skin Patient education given.  Trial nystatin - nystatin cream (MYCOSTATIN); Apply 1 application topically 2 (two) times daily.  Dispense: 30 g; Refill: 1   Follow Up Instructions:    I discussed the assessment and treatment plan with the patient. The patient was provided an opportunity to ask questions and all were answered. The patient agreed with the plan and demonstrated an understanding of the instructions.   The patient was advised to call back or seek an in-person evaluation if the symptoms worsen or if the condition fails to improve as anticipated.    Meds ordered this encounter  Medications  . nystatin cream (MYCOSTATIN)    Sig: Apply 1 application topically 2 (two) times daily.    Dispense:  30 g    Refill:  1    Order Specific Question:   Supervising Provider    Answer:   Storm Frisk [[id number]]    Follow-up: Return if symptoms worsen or fail to improve.    Kasandra Knudsen Tai Skelly,  PA-C

## 2020-04-19 NOTE — Patient Instructions (Signed)
I encourage you to use rash has resolved, for 2 days.  Be sure to keep area clean and dry.  Please let us know if there is anything else we can do for you  Roney Jaffe, PA-C Physician Assistant [facility] [email or web address]    Skin Yeast Infection  A skin yeast infection is a condition in which there is an overgrowth of yeast (candida) that normally lives on the skin. This condition usually occurs in areas of the skin that are constantly warm and moist, such as the armpits or the groin. What are the causes? This condition is caused by a change in the normal balance of the yeast and bacteria that live on the skin. What increases the risk? You are more likely to develop this condition if you:  Are obese.  Are pregnant.  Take birth control pills.  Have diabetes.  Take antibiotic medicines.  Take steroid medicines.  Are malnourished.  Have a weak body defense system (immune system).  Are 7 years of age or older.  Wear tight clothing. What are the signs or symptoms? The most common symptom of this condition is itchiness in the affected area. Other symptoms include:  Red, swollen area of the skin.  Bumps on the skin. How is this diagnosed?  This condition is diagnosed with a medical history and physical exam.  Your health care provider may check for yeast by taking light scrapings of the skin to be viewed under a microscope. How is this treated? This condition is treated with medicine. Medicines may be prescribed or be available over the counter. The medicines may be:  Taken by mouth (orally).  Applied as a cream or powder to your skin. Follow these instructions at home:  Take or apply over-the-counter and prescription medicines only as told by your health care provider.  Maintain a healthy weight. If you need help losing weight, talk with your health care provider.  Keep your skin clean and dry.  If  you have diabetes, keep your blood sugar under control.  Keep all follow-up visits as told by your health care provider. This is important.   Contact a health care provider if:  Your symptoms go away and then return.  Your symptoms do not get better with treatment.  Your symptoms get worse.  Your rash spreads.  You have a fever or chills.  You have new symptoms.  You have new warmth or redness of your skin. Summary  A skin yeast infection is a condition in which there is an overgrowth of yeast (candida) that normally lives on the skin. This condition is caused by a change in the normal balance of the yeast and bacteria that live on the skin.  Take or apply over-the-counter and prescription medicines only as told by your health care provider.  Keep your skin clean and dry.  Contact a health care provider if your symptoms do not get better with treatment. This information is not intended to replace advice given to you by your health care provider. Make sure you discuss any questions you have with your health care provider. Document Revised: 07/28/2017 Document Reviewed: 07/28/2017 Elsevier Patient Education  2021 [employer].

## 2020-04-24 NOTE — Progress Notes (Deleted)
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  UPDATE: Last seen in August 2020.  She was restarted on nortriptyline.   Current NSAIDS:None Current analgesics:Tylenol. Current triptans:Maxalt 10mg , sumatriptan NS Current ergotamine:no Current anti-emetic:no Current muscle relaxants:no Current anti-anxiolytic:no Current sleep aide:no Current Antihypertensive medications:no Current Antidepressant medications:Nortriptyline 25 mg (without) Current Anticonvulsant medications:no Current anti-CGRP:no Current Vitamins/Herbal/Supplements:no Current Antihistamines/Decongestants:no Other therapy:no Other medication:none  Caffeine:no Alcohol:rarely Smoker:Marijuana once in awhile Diet:Water, juice, rarely soda Exercise:Trying to Depression:sometimes; Anxiety:yes Other pain:Ankle pain Sleep hygiene:poor. She hasn't been following the sleep hygiene recommendations.  HISTORY: Onset: Childhood Location:Left sided, back of head Quality:Pounding, stabbing/needles Initial intensity:Severe. Shedenies new headache, thunderclap headache Aura:no Prodrome:no Postdrome:fatigue Associated symptoms: Photophobia, phonophobia, blurred vision. Sometimes nausea.Shedenies associated unilateral numbness or weakness. Initial duration:2 to 3 weeks InitialFrequency:Typically every 2 to 3 months InitialFrequency of abortive medication:Rarely Triggers: Emotional stress. She says it doe snot seem to be related to her menstrual cycle. Relieving factors: Nothing Activity:Aggravates. Cannot function  CT head from 06/03/18 showed chronic changes related to germinal matrix hemorrhage but no new abnormalities.  Past NSAIDS:Naproxen 500mg , indomethacin  50mg , Cambia, Sprix NS Past analgesics:Tramadol (variable efficacy), Tylenol Past abortive triptans:No Past abortive ergotamine:No Past muscle relaxants:baclofen Past anti-emetic:no Past antihypertensive medications:no Past antidepressant medications:no Past anticonvulsant medications:topiramate 100mg  twice daily, zonisamide Past anti-CGRP:no Past vitamins/Herbal/Supplements:none Past antihistamines/decongestants:none Other past therapies:none  Family history of headache:Mom  PAST MEDICAL HISTORY: Past Medical History:  Diagnosis Date  . Asthma   . CP (cerebral palsy) (HCC)   . Migraine     MEDICATIONS: Current Outpatient Medications on File Prior to Visit  Medication Sig Dispense Refill  . fluconazole (DIFLUCAN) 150 MG tablet Take one tablet now. Take second tablet in 72 hours if still feeling symptoms. 2 tablet 0  . metroNIDAZOLE (FLAGYL) 500 MG tablet Take 1 tablet (500 mg total) by mouth 2 (two) times daily. 14 tablet 0  . nystatin cream (MYCOSTATIN) Apply 1 application topically 2 (two) times daily. 30 g 1  . [DISCONTINUED] norethindrone (ORTHO MICRONOR) 0.35 MG tablet Take 1 tablet (0.35 mg total) by mouth daily. 1 Package 11   No current facility-administered medications on file prior to visit.    ALLERGIES: No Known Allergies  FAMILY HISTORY: Family History  Problem Relation Age of Onset  . Diabetes Mother   . Hypertension Mother   . Diabetes Maternal Grandmother   . Hypertension Maternal Grandmother   . High Cholesterol Maternal Grandmother   . CVA Maternal Grandfather     SOCIAL HISTORY: Social History   Socioeconomic History  . Marital status: Single    Spouse name: Not on file  . Number of children: Not on file  . Years of education: Not on file  . Highest education level: 12th grade  Occupational History    Employer: [employer]  Tobacco Use  . Smoking status: Never Smoker  . Smokeless tobacco: Never Used   Substance and Sexual Activity  . Alcohol use: No  . Drug use: Yes    Types: Marijuana    Comment: a few times  . Sexual activity: Yes    Birth control/protection: Condom  Other Topics Concern  . Not on file  Social History Narrative   Patient is left-handed. She lives with her boyfriend in a 1 story house. She rarely drinks caffeine. She is active at work.   Social Determinants of  Health   Financial Resource Strain: Not on file  Food Insecurity: Not on file  Transportation Needs: Not on file  Physical Activity: Not on file  Stress: Not on file  Social Connections: Not on file  Intimate Partner Violence: Not on file     Objective:  *** General: No acute distress.  Patient appears well-groomed.   Head:  Normocephalic/atraumatic Eyes:  Fundi examined but not visualized Neck: supple, no paraspinal tenderness, full range of motion Heart:  Regular rate and rhythm Lungs:  Clear to auscultation bilaterally Back: No paraspinal tenderness Neurological Exam: alert and oriented to person, place, and time. Attention span and concentration intact, recent and remote memory intact, fund of knowledge intact.  Speech fluent and not dysarthric, language intact.  CN II-XII intact. Bulk and tone normal, muscle strength 5/5 throughout.  Sensation to light touch, temperature and vibration intact.  Deep tendon reflexes 2+ throughout, toes downgoing.  Finger to nose and heel to shin testing intact.  Gait normal, Romberg negative.   Assessment/Plan:   Migraine without aura, without status migrainosus, *** intractable  1.  Migraine prevention:  *** 2.  Migraine rescue:  *** 3.  Limit use of pain relievers to no more than 2 days out of week to prevent risk of rebound or medication-overuse headache. 4.  Keep headache diary 5.  Follow up ***  Shon Millet, DO  CC: Marcy Siren, DO

## 2020-04-26 ENCOUNTER — Ambulatory Visit: Payer: BC Managed Care – PPO | Admitting: Neurology

## 2020-05-04 ENCOUNTER — Encounter: Payer: BC Managed Care – PPO | Admitting: Family

## 2020-05-04 NOTE — Progress Notes (Signed)
Patient did not show for appointment.   

## 2020-05-25 LAB — OB RESULTS CONSOLE GC/CHLAMYDIA
Chlamydia: NEGATIVE
Gonorrhea: NEGATIVE

## 2020-05-25 LAB — OB RESULTS CONSOLE ABO/RH: RH Type: POSITIVE

## 2020-05-25 LAB — OB RESULTS CONSOLE RUBELLA ANTIBODY, IGM: Rubella: IMMUNE

## 2020-05-25 LAB — OB RESULTS CONSOLE HEPATITIS B SURFACE ANTIGEN: Hepatitis B Surface Ag: NEGATIVE

## 2020-05-25 LAB — OB RESULTS CONSOLE HIV ANTIBODY (ROUTINE TESTING): HIV: NONREACTIVE

## 2020-08-10 NOTE — Progress Notes (Deleted)
NEUROLOGY FOLLOW UP OFFICE NOTE  Tammy Randall [id number]  Assessment/Plan:   Migraine without aura, without status migrainosus, not intractable  1.  Migraine prevention:  *** 2.  Migraine rescue:  *** 3.  Limit use of pain relievers to no more than 2 days out of week to prevent risk of rebound or medication-overuse headache. 4.  Keep headache diary 5.  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  UPDATE: Last seen in August 2020.  At that time, I restarted her on nortriptyline. ***  She was seen in the ED on 05/14/18 for intractable headache where she received a headache cocktail of Toradol, Reglan and Benadryl. Current NSAIDS:None Current analgesics:Tylenol. Current triptans:Maxalt 10mg  (without) Current ergotamine:no Current anti-emetic:no Current muscle relaxants:no Current anti-anxiolytic:no Current sleep aide:no Current Antihypertensive medications:no Current Antidepressant medications:Nortriptyline 25 mg  Current Anticonvulsant medications:no Current anti-CGRP:no Current Vitamins/Herbal/Supplements:no Current Antihistamines/Decongestants:no Other therapy:no Other medication:none  Caffeine:no Alcohol:rarely Smoker:Marijuana once in awhile Diet:Water, juice, rarely soda Exercise:Trying to Depression:sometimes; Anxiety:yes Other pain:Ankle pain Sleep hygiene:poor. She hasn't been following the sleep hygiene recommendations.  HISTORY: Onset: Childhood Location:Left sided, back of head Quality:Pounding, stabbing/needles Initial intensity:Severe. Shedenies new headache, thunderclap headache Aura:no Prodrome:no Postdrome:fatigue Associated symptoms: Photophobia, phonophobia, blurred vision. Sometimes nausea.Shedenies associated unilateral numbness or  weakness. Initial duration:2 to 3 weeks InitialFrequency:Typically every 2 to 3 months InitialFrequency of abortive medication:Rarely Triggers: Emotional stress. She says it doe snot seem to be related to her menstrual cycle. Relieving factors: Nothing Activity:Aggravates. Cannot function  CT head from 06/03/18 showed chronic changes related to germinal matrix hemorrhage but no new abnormalities.  Past NSAIDS:Naproxen 500mg , indomethacin 50mg , Cambia, Sprix NS Past analgesics:Tramadol (variable efficacy), Tylenol Past abortive triptans:No Past abortive ergotamine:No Past muscle relaxants:baclofen Past anti-emetic:no Past antihypertensive medications:no Past antidepressant medications:no Past anticonvulsant medications:topiramate 100mg  twice daily, zonisamide Past anti-CGRP:no Past vitamins/Herbal/Supplements:none Past antihistamines/decongestants:none Other past therapies:none  Family history of headache:Mom  PAST MEDICAL HISTORY: Past Medical History:  Diagnosis Date  . Asthma   . CP (cerebral palsy) (HCC)   . Migraine     MEDICATIONS: Current Outpatient Medications on File Prior to Visit  Medication Sig Dispense Refill  . fluconazole (DIFLUCAN) 150 MG tablet Take one tablet now. Take second tablet in 72 hours if still feeling symptoms. 2 tablet 0  . metroNIDAZOLE (FLAGYL) 500 MG tablet Take 1 tablet (500 mg total) by mouth 2 (two) times daily. 14 tablet 0  . nystatin cream (MYCOSTATIN) Apply 1 application topically 2 (two) times daily. 30 g 1  . [DISCONTINUED] norethindrone (ORTHO MICRONOR) 0.35 MG tablet Take 1 tablet (0.35 mg total) by mouth daily. 1 Package 11   No current facility-administered medications on file prior to visit.    ALLERGIES: No Known Allergies  FAMILY HISTORY: Family History  Problem Relation Age of Onset  . Diabetes Mother   . Hypertension Mother   . Diabetes Maternal Grandmother   . Hypertension  Maternal Grandmother   . High Cholesterol Maternal Grandmother   . CVA Maternal Grandfather       Objective:  *** General: No acute distress.  Patient appears ***-groomed.   Head:  Normocephalic/atraumatic Eyes:  Fundi examined but not visualized Neck: supple, no paraspinal tenderness, full range of motion Heart:  Regular rate and rhythm Lungs:  Clear to auscultation bilaterally Back: No paraspinal tenderness Neurological Exam: alert and oriented to person, place, and time. Attention span and concentration intact, recent and  remote memory intact, fund of knowledge intact.  Speech fluent and not dysarthric, language intact.  CN II-XII intact. Bulk and tone normal, muscle strength 5/5 throughout.  Sensation to light touch, temperature and vibration intact.  Deep tendon reflexes 2+ throughout, toes downgoing.  Finger to nose and heel to shin testing intact.  Gait normal, Romberg negative.     Shon Millet, DO  CC: ***

## 2020-08-13 ENCOUNTER — Ambulatory Visit: Payer: BC Managed Care – PPO | Admitting: Neurology

## 2020-10-02 LAB — OB RESULTS CONSOLE RPR: RPR: NONREACTIVE

## 2020-10-13 IMAGING — DX ABDOMEN - 1 VIEW
2 series · 2 of 2 positions shown · non-contrast
Comparison: None.

CLINICAL DATA: Abdominal pain

EXAM:
ABDOMEN - 1 VIEW

[abdomen kub (1 of 2)]
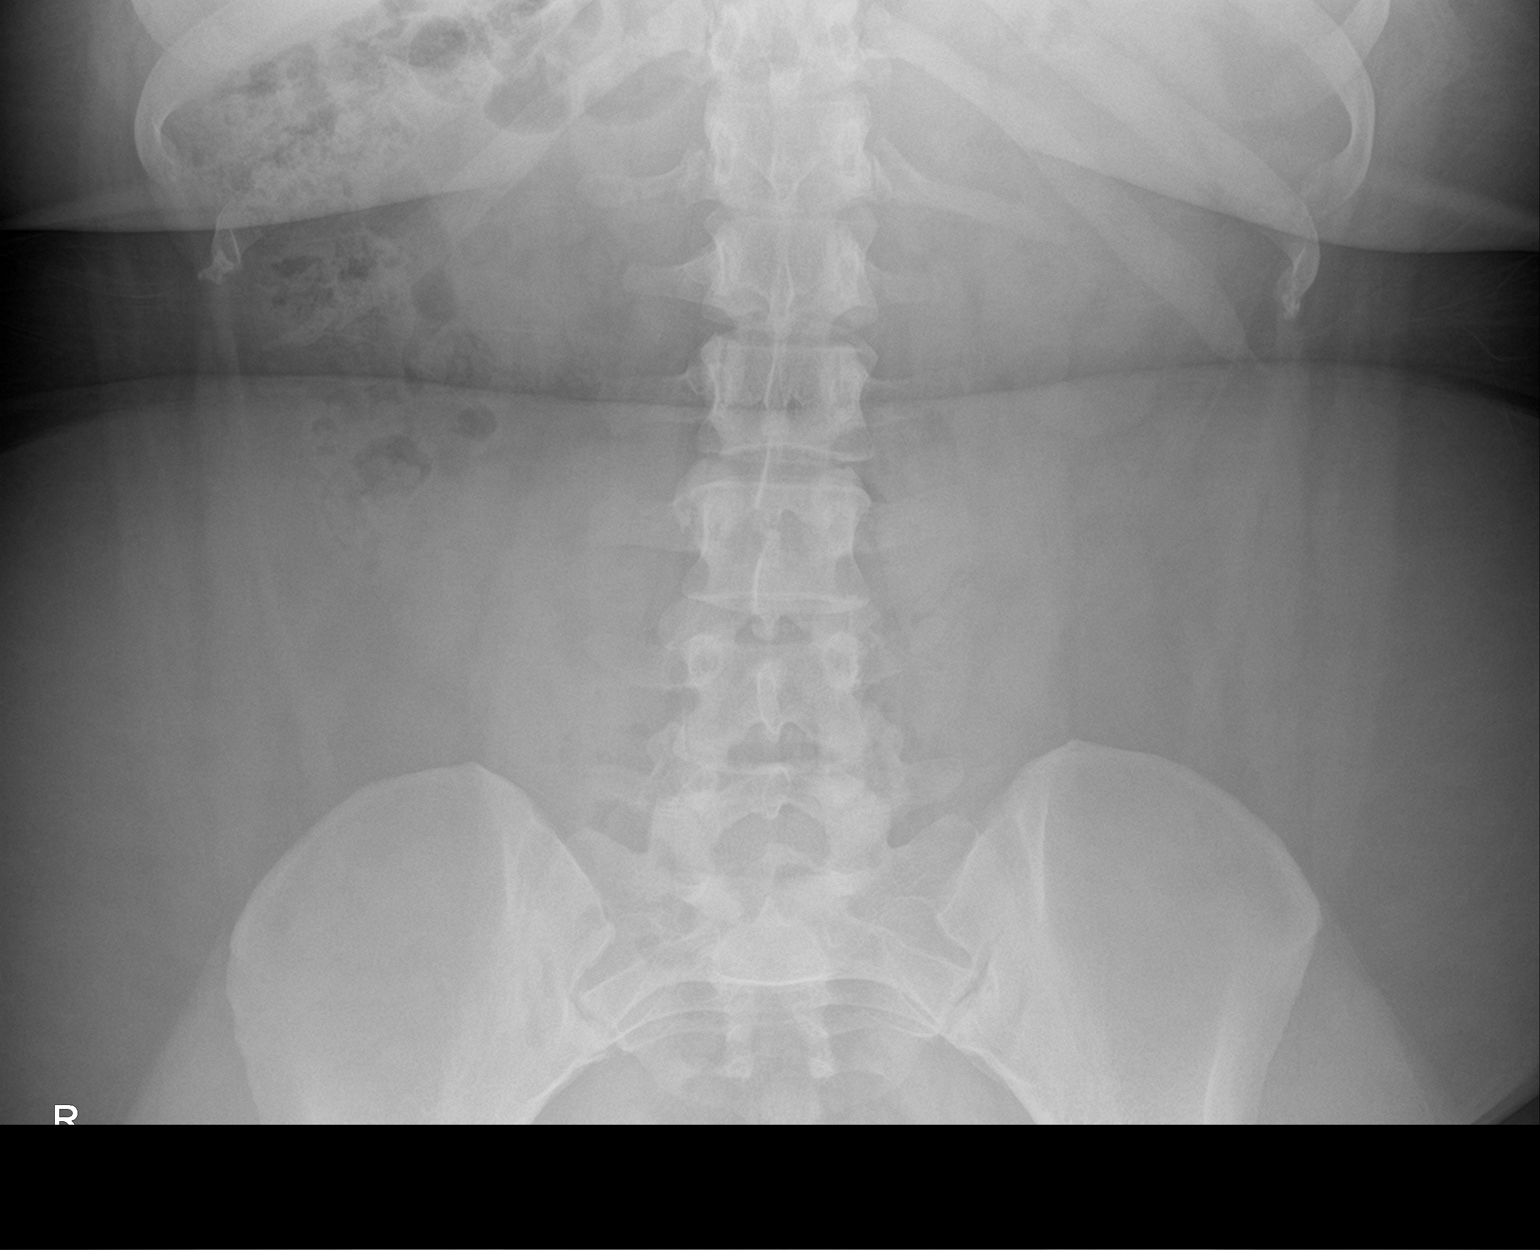

[abdomen kub (2 of 2)]
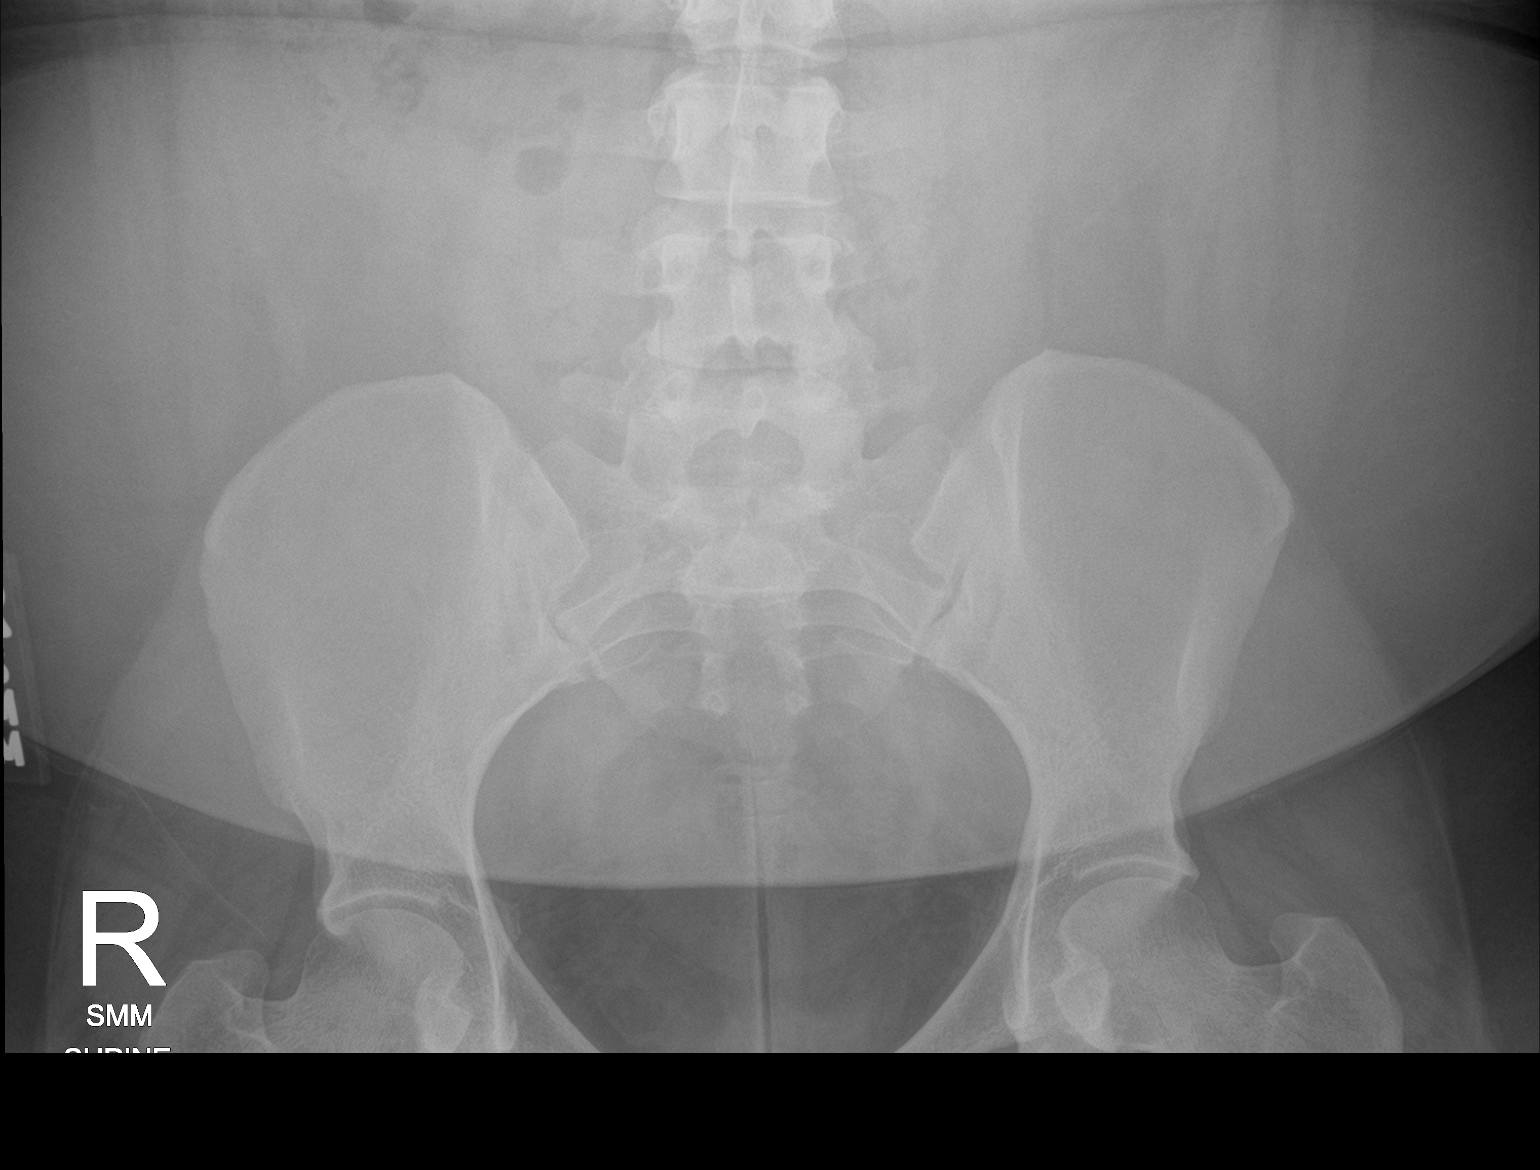

[2 of 2 positions shown; findings below may reference images not displayed]

FINDINGS: There is moderate stool in the colon. There is a paucity of bowel
gas. There is no appreciable bowel dilatation or air-fluid level to
suggest bowel obstruction. No evident free air. No abnormal
calcifications.
IMPRESSION: Moderate stool in colon. There is a paucity of bowel gas. This
finding may be seen normally but also may be indicative of enteritis
or early ileus. Bowel obstruction not felt be likely. No evident
free air.

## 2020-10-17 ENCOUNTER — Ambulatory Visit: Payer: BC Managed Care – PPO | Admitting: Neurology

## 2020-12-07 ENCOUNTER — Encounter (HOSPITAL_COMMUNITY): Payer: Self-pay | Admitting: *Deleted

## 2020-12-07 ENCOUNTER — Telehealth (HOSPITAL_COMMUNITY): Payer: Self-pay | Admitting: *Deleted

## 2020-12-07 NOTE — Telephone Encounter (Signed)
Preadmission screen  

## 2020-12-10 ENCOUNTER — Other Ambulatory Visit: Payer: Self-pay

## 2020-12-10 ENCOUNTER — Telehealth (HOSPITAL_COMMUNITY): Payer: Self-pay | Admitting: *Deleted

## 2020-12-10 ENCOUNTER — Encounter (HOSPITAL_COMMUNITY): Payer: Self-pay | Admitting: *Deleted

## 2020-12-10 ENCOUNTER — Inpatient Hospital Stay (HOSPITAL_COMMUNITY)
Admission: AD | Admit: 2020-12-10 | Discharge: 2020-12-14 | DRG: 788 | Disposition: A | Discharge status: Home / Self Care | Payer: BC Managed Care – PPO | Attending: Obstetrics and Gynecology | Admitting: Obstetrics and Gynecology

## 2020-12-10 ENCOUNTER — Encounter (HOSPITAL_COMMUNITY): Payer: Self-pay

## 2020-12-10 ENCOUNTER — Encounter (HOSPITAL_COMMUNITY): Payer: Self-pay | Admitting: Obstetrics and Gynecology

## 2020-12-10 DIAGNOSIS — Z23 Encounter for immunization: Secondary | ICD-10-CM | POA: Diagnosis not present

## 2020-12-10 DIAGNOSIS — Z8616 Personal history of COVID-19: Secondary | ICD-10-CM

## 2020-12-10 DIAGNOSIS — Z20822 Contact with and (suspected) exposure to covid-19: Secondary | ICD-10-CM | POA: Diagnosis present

## 2020-12-10 DIAGNOSIS — O4103X0 Oligohydramnios, third trimester, not applicable or unspecified: Secondary | ICD-10-CM | POA: Diagnosis present

## 2020-12-10 DIAGNOSIS — Z3A38 38 weeks gestation of pregnancy: Secondary | ICD-10-CM

## 2020-12-10 DIAGNOSIS — O99214 Obesity complicating childbirth: Secondary | ICD-10-CM | POA: Diagnosis present

## 2020-12-10 LAB — CBC
HCT: 37.8 % (ref 36.0–46.0)
Hemoglobin: 12.3 g/dL (ref 12.0–15.0)
MCH: 28.5 pg (ref 26.0–34.0)
MCHC: 32.5 g/dL (ref 30.0–36.0)
MCV: 87.7 fL (ref 80.0–100.0)
Platelets: 249 10*3/uL (ref 150–400)
RBC: 4.31 MIL/uL (ref 3.87–5.11)
RDW: 13.8 % (ref 11.5–15.5)
WBC: 11.5 10*3/uL — ABNORMAL HIGH (ref 4.0–10.5)
nRBC: 0 % (ref 0.0–0.2)

## 2020-12-10 LAB — TYPE AND SCREEN
ABO/RH(D): O POS
Antibody Screen: NEGATIVE

## 2020-12-10 MED ORDER — LACTATED RINGERS IV SOLN: INTRAVENOUS | Status: DC

## 2020-12-10 MED ORDER — OXYTOCIN-SODIUM CHLORIDE 30-0.9 UT/500ML-% IV SOLN: 2.5000 [IU]/h | INTRAVENOUS | Status: DC

## 2020-12-10 MED ORDER — OXYCODONE-ACETAMINOPHEN 5-325 MG PO TABS: 2.0000 | ORAL_TABLET | ORAL | Status: DC | PRN

## 2020-12-10 MED ORDER — OXYCODONE-ACETAMINOPHEN 5-325 MG PO TABS: 1.0000 | ORAL_TABLET | ORAL | Status: DC | PRN

## 2020-12-10 MED ORDER — OXYTOCIN BOLUS FROM INFUSION: 333.0000 mL | Freq: Once | INTRAVENOUS | Status: DC

## 2020-12-10 MED ORDER — SOD CITRATE-CITRIC ACID 500-334 MG/5ML PO SOLN
30.0000 mL | ORAL | Status: DC | PRN
  Administered 2020-12-11: 30 mL via ORAL
  Filled 2020-12-10: qty 30

## 2020-12-10 MED ORDER — ONDANSETRON HCL 4 MG/2ML IJ SOLN: 4.0000 mg | Freq: Four times a day (QID) | INTRAMUSCULAR | Status: DC | PRN

## 2020-12-10 MED ORDER — ACETAMINOPHEN 325 MG PO TABS: 650.0000 mg | ORAL_TABLET | ORAL | Status: DC | PRN

## 2020-12-10 MED ORDER — LACTATED RINGERS IV SOLN
500.0000 mL | INTRAVENOUS | Status: DC | PRN
  Administered 2020-12-11: 500 mL via INTRAVENOUS

## 2020-12-10 MED ORDER — FLEET ENEMA 7-19 GM/118ML RE ENEM: 1.0000 | ENEMA | RECTAL | Status: DC | PRN

## 2020-12-10 MED ORDER — MISOPROSTOL 25 MCG QUARTER TABLET
25.0000 ug | ORAL_TABLET | Freq: Four times a day (QID) | ORAL | Status: DC
  Administered 2020-12-10 – 2020-12-11 (×2): 25 ug via VAGINAL
  Filled 2020-12-10 (×2): qty 1

## 2020-12-10 MED ORDER — LIDOCAINE HCL (PF) 1 % IJ SOLN
30.0000 mL | INTRAMUSCULAR | Status: DC | PRN
Start: 2020-12-10 — End: 2020-12-12

## 2020-12-10 NOTE — Telephone Encounter (Signed)
Preadmission screen  

## 2020-12-10 NOTE — H&P (Signed)
Tammy Randall is a 25 y.o. female presenting for IOL for mild idiopathic oligo with AFI 4-5 on sono today.AGA by sono. OB History     Gravida  1   Para      Term      Preterm      AB      Living         SAB      IAB      Ectopic      Multiple      Live Births             Past Medical History:  Diagnosis Date   Asthma    CP (cerebral palsy) (HCC)    Migraine    History reviewed. No pertinent surgical history. Family History: family history includes CVA in her maternal grandfather; Diabetes in her maternal grandmother and mother; High Cholesterol in her maternal grandmother; Hypertension in her maternal grandmother and mother. Social History:  reports that she has never smoked. She has never used smokeless tobacco. She reports current drug use. Drug: Marijuana. She reports that she does not drink alcohol.     Maternal Diabetes: No Genetic Screening: Normal Maternal Ultrasounds/Referrals: Normal Fetal Ultrasounds or other Referrals:  None Maternal Substance Abuse:  No Significant Maternal Medications:  None Significant Maternal Lab Results:  Group B Strep negative Other Comments:  None  Review of Systems  Constitutional: Negative.   All other systems reviewed and are negative. Maternal Medical History:  Fetal activity: Perceived fetal activity is normal.   Prenatal complications: Oligohydramnios.   Prenatal Complications - Diabetes: none.    Blood pressure 108/65, pulse (!) 110, temperature 98.2 F (36.8 C), temperature source Oral, resp. rate 17, height 5\' 6"  (1.676 m), weight 134 kg, last menstrual period 02/10/2020. Maternal Exam:  Uterine Assessment: Contraction strength is mild.  Contraction frequency is rare.  Abdomen: Patient reports no abdominal tenderness. Fetal presentation: vertex Introitus: Normal vulva. Normal vagina.  Ferning test: negative.  Nitrazine test: negative. Pelvis: adequate for delivery.   Cervix: Cervix evaluated by  digital exam.    Physical Exam Constitutional:      Appearance: Normal appearance. She is obese.  HENT:     Head: Normocephalic and atraumatic.  Cardiovascular:     Rate and Rhythm: Normal rate and regular rhythm.  Pulmonary:     Effort: Pulmonary effort is normal.     Breath sounds: Normal breath sounds.  Abdominal:     Palpations: Abdomen is soft.  Genitourinary:    General: Normal vulva.  Musculoskeletal:        General: Normal range of motion.     Cervical back: Normal range of motion and neck supple.  Skin:    General: Skin is warm and dry.  Neurological:     General: No focal deficit present.     Mental Status: She is alert and oriented to person, place, and time.  Psychiatric:        Mood and Affect: Mood normal.        Behavior: Behavior normal.    Prenatal labs: ABO, Rh: O/Positive/-- (03/04 0000) Antibody:  neg Rubella: Immune (03/04 0000) RPR: Nonreactive (07/12 0000)  HBsAg: Negative (03/04 0000)  HIV: Non-reactive (03/04 0000)  GBS:   neg  Assessment/Plan: 38+ week IUP Covid in pregnancy- mild, not hospitalized Mild idiopathic oligo Reactive NST. Cytotec IOL   Leela Vanbrocklin J 12/10/2020, 10:16 PM

## 2020-12-11 ENCOUNTER — Encounter (HOSPITAL_COMMUNITY)
Admission: AD | Disposition: A | Discharge status: Home / Self Care | Payer: Self-pay | Source: Home / Self Care | Attending: Obstetrics and Gynecology

## 2020-12-11 ENCOUNTER — Encounter (HOSPITAL_COMMUNITY): Payer: Self-pay | Admitting: Obstetrics and Gynecology

## 2020-12-11 ENCOUNTER — Inpatient Hospital Stay (HOSPITAL_COMMUNITY): Payer: BC Managed Care – PPO | Admitting: Anesthesiology

## 2020-12-11 LAB — RPR: RPR Ser Ql: NONREACTIVE

## 2020-12-11 LAB — SARS CORONAVIRUS 2 (TAT 6-24 HRS): SARS Coronavirus 2: NEGATIVE

## 2020-12-11 SURGERY — Surgical Case
Anesthesia: Epidural

## 2020-12-11 MED ORDER — MEPERIDINE HCL 25 MG/ML IJ SOLN
INTRAMUSCULAR | Status: AC
  Filled 2020-12-11: qty 1

## 2020-12-11 MED ORDER — FENTANYL CITRATE (PF) 100 MCG/2ML IJ SOLN
INTRAMUSCULAR | Status: DC | PRN
  Administered 2020-12-11: 100 ug via EPIDURAL

## 2020-12-11 MED ORDER — PHENYLEPHRINE 40 MCG/ML (10ML) SYRINGE FOR IV PUSH (FOR BLOOD PRESSURE SUPPORT): 80.0000 ug | PREFILLED_SYRINGE | INTRAVENOUS | Status: DC | PRN

## 2020-12-11 MED ORDER — LACTATED RINGERS IV SOLN
500.0000 mL | Freq: Once | INTRAVENOUS | Status: AC
  Administered 2020-12-11: 500 mL via INTRAVENOUS

## 2020-12-11 MED ORDER — FENTANYL-BUPIVACAINE-NACL 0.5-0.125-0.9 MG/250ML-% EP SOLN
12.0000 mL/h | EPIDURAL | Status: DC | PRN
  Administered 2020-12-11: 12 mL/h via EPIDURAL
  Filled 2020-12-11: qty 250

## 2020-12-11 MED ORDER — LACTATED RINGERS IV SOLN: INTRAVENOUS | Status: DC | PRN

## 2020-12-11 MED ORDER — OXYTOCIN-SODIUM CHLORIDE 30-0.9 UT/500ML-% IV SOLN
1.0000 m[IU]/min | INTRAVENOUS | Status: DC
  Administered 2020-12-11: 2 m[IU]/min via INTRAVENOUS
  Filled 2020-12-11: qty 500

## 2020-12-11 MED ORDER — ONDANSETRON HCL 4 MG/2ML IJ SOLN
INTRAMUSCULAR | Status: DC | PRN
  Administered 2020-12-11: 4 mg via INTRAVENOUS

## 2020-12-11 MED ORDER — EPHEDRINE 5 MG/ML INJ: 10.0000 mg | INTRAVENOUS | Status: DC | PRN

## 2020-12-11 MED ORDER — LACTATED RINGERS AMNIOINFUSION: INTRAVENOUS | Status: DC

## 2020-12-11 MED ORDER — FENTANYL CITRATE (PF) 100 MCG/2ML IJ SOLN
INTRAMUSCULAR | Status: AC
  Filled 2020-12-11: qty 2

## 2020-12-11 MED ORDER — TRANEXAMIC ACID-NACL 1000-0.7 MG/100ML-% IV SOLN
INTRAVENOUS | Status: DC | PRN
  Administered 2020-12-11: 1000 mg via INTRAVENOUS

## 2020-12-11 MED ORDER — DIPHENHYDRAMINE HCL 50 MG/ML IJ SOLN
12.5000 mg | INTRAMUSCULAR | Status: DC | PRN
Start: 2020-12-11 — End: 2020-12-12

## 2020-12-11 MED ORDER — LIDOCAINE HCL (PF) 1 % IJ SOLN
INTRAMUSCULAR | Status: DC | PRN
  Administered 2020-12-11: 3 mL via EPIDURAL
  Administered 2020-12-11: 2 mL via EPIDURAL
  Administered 2020-12-11: 5 mL via EPIDURAL

## 2020-12-11 MED ORDER — DEXAMETHASONE SODIUM PHOSPHATE 10 MG/ML IJ SOLN
INTRAMUSCULAR | Status: AC
  Filled 2020-12-11: qty 1

## 2020-12-11 MED ORDER — ONDANSETRON HCL 4 MG/2ML IJ SOLN
INTRAMUSCULAR | Status: AC
  Filled 2020-12-11: qty 2

## 2020-12-11 MED ORDER — PHENYLEPHRINE 40 MCG/ML (10ML) SYRINGE FOR IV PUSH (FOR BLOOD PRESSURE SUPPORT)
80.0000 ug | PREFILLED_SYRINGE | INTRAVENOUS | Status: DC | PRN
Start: 2020-12-11 — End: 2020-12-12
  Filled 2020-12-11: qty 10

## 2020-12-11 MED ORDER — DEXTROSE 5 % IV SOLN
INTRAVENOUS | Status: DC | PRN
  Administered 2020-12-11: 3 g via INTRAVENOUS

## 2020-12-11 MED ORDER — LIDOCAINE-EPINEPHRINE (PF) 2 %-1:200000 IJ SOLN
INTRAMUSCULAR | Status: DC | PRN
  Administered 2020-12-11 (×3): 5 mL via EPIDURAL

## 2020-12-11 MED ORDER — PHENYLEPHRINE HCL (PRESSORS) 10 MG/ML IV SOLN
INTRAVENOUS | Status: DC | PRN
  Administered 2020-12-11: 40 ug via INTRAVENOUS
  Administered 2020-12-11 (×2): 80 ug via INTRAVENOUS
  Administered 2020-12-11: 40 ug via INTRAVENOUS

## 2020-12-11 MED ORDER — OXYTOCIN-SODIUM CHLORIDE 30-0.9 UT/500ML-% IV SOLN
INTRAVENOUS | Status: DC | PRN
  Administered 2020-12-11: 400 mL via INTRAVENOUS

## 2020-12-11 MED ORDER — TERBUTALINE SULFATE 1 MG/ML IJ SOLN
0.2500 mg | Freq: Once | INTRAMUSCULAR | Status: DC | PRN
Start: 2020-12-11 — End: 2020-12-12

## 2020-12-11 MED ORDER — FENTANYL CITRATE (PF) 100 MCG/2ML IJ SOLN
100.0000 ug | INTRAMUSCULAR | Status: DC | PRN
  Administered 2020-12-11 (×2): 100 ug via INTRAVENOUS
  Filled 2020-12-11 (×2): qty 2

## 2020-12-11 MED ORDER — MORPHINE SULFATE (PF) 0.5 MG/ML IJ SOLN
INTRAMUSCULAR | Status: AC
  Filled 2020-12-11: qty 10

## 2020-12-11 SURGICAL SUPPLY — 43 items
BENZOIN TINCTURE PRP APPL 2/3 (GAUZE/BANDAGES/DRESSINGS) ×2 IMPLANT
CANISTER PREVENA PLUS 150 (CANNISTER) ×2 IMPLANT
CHLORAPREP W/TINT 26ML (MISCELLANEOUS) ×2 IMPLANT
CLAMP CORD UMBIL (MISCELLANEOUS) IMPLANT
CLOTH BEACON ORANGE TIMEOUT ST (SAFETY) ×2 IMPLANT
DRESSING PREVENA PLUS CUSTOM (GAUZE/BANDAGES/DRESSINGS) ×1 IMPLANT
DRSG OPSITE POSTOP 4X10 (GAUZE/BANDAGES/DRESSINGS) ×2 IMPLANT
DRSG PREVENA PLUS CUSTOM (GAUZE/BANDAGES/DRESSINGS) ×2
ELECT REM PT RETURN 9FT ADLT (ELECTROSURGICAL) ×2
ELECTRODE REM PT RTRN 9FT ADLT (ELECTROSURGICAL) ×1 IMPLANT
EXTRACTOR VACUUM KIWI (MISCELLANEOUS) ×2 IMPLANT
GLOVE BIOGEL PI IND STRL 6.5 (GLOVE) ×1 IMPLANT
GLOVE BIOGEL PI IND STRL 7.0 (GLOVE) ×2 IMPLANT
GLOVE BIOGEL PI INDICATOR 6.5 (GLOVE) ×1
GLOVE BIOGEL PI INDICATOR 7.0 (GLOVE) ×2
GLOVE ECLIPSE 6.0 STRL STRAW (GLOVE) ×2 IMPLANT
GLOVE SURG ENC TEXT LTX SZ6.5 (GLOVE) ×2 IMPLANT
GOWN STRL REUS W/TWL LRG LVL3 (GOWN DISPOSABLE) ×4 IMPLANT
KIT ABG SYR 3ML LUER SLIP (SYRINGE) IMPLANT
NEEDLE HYPO 22GX1.5 SAFETY (NEEDLE) ×2 IMPLANT
NEEDLE HYPO 25X5/8 SAFETYGLIDE (NEEDLE) IMPLANT
NS IRRIG 1000ML POUR BTL (IV SOLUTION) ×2 IMPLANT
PACK C SECTION WH (CUSTOM PROCEDURE TRAY) ×2 IMPLANT
PAD OB MATERNITY 4.3X12.25 (PERSONAL CARE ITEMS) ×2 IMPLANT
PENCIL SMOKE EVAC W/HOLSTER (ELECTROSURGICAL) ×2 IMPLANT
RETRACTOR WND ALEXIS 25 LRG (MISCELLANEOUS) IMPLANT
RTRCTR WOUND ALEXIS 25CM LRG (MISCELLANEOUS)
STRIP CLOSURE SKIN 1/2X4 (GAUZE/BANDAGES/DRESSINGS) ×2 IMPLANT
STRIP CLOSURE SKIN 1/4X4 (GAUZE/BANDAGES/DRESSINGS) ×2 IMPLANT
SUT MNCRL 0 VIOLET CTX 36 (SUTURE) ×2 IMPLANT
SUT MNCRL AB 3-0 PS2 27 (SUTURE) ×2 IMPLANT
SUT MONOCRYL 0 CTX 36 (SUTURE) ×2
SUT PLAIN 2 0 XLH (SUTURE) IMPLANT
SUT VIC AB 0 CT1 36 (SUTURE) ×4 IMPLANT
SUT VIC AB 2-0 CT1 27 (SUTURE) ×1
SUT VIC AB 2-0 CT1 TAPERPNT 27 (SUTURE) ×1 IMPLANT
SUT VIC AB 3-0 CT1 27 (SUTURE) ×1
SUT VIC AB 3-0 CT1 TAPERPNT 27 (SUTURE) ×1 IMPLANT
SUT VIC AB 4-0 PS2 27 (SUTURE) ×2 IMPLANT
SYR CONTROL 10ML LL (SYRINGE) ×2 IMPLANT
TOWEL OR 17X24 6PK STRL BLUE (TOWEL DISPOSABLE) ×2 IMPLANT
TRAY FOLEY W/BAG SLVR 14FR LF (SET/KITS/TRAYS/PACK) IMPLANT
WATER STERILE IRR 1000ML POUR (IV SOLUTION) ×2 IMPLANT

## 2020-12-11 NOTE — Progress Notes (Signed)
Tearful, upset about decels noted on FHT Comfortable with epidural  Patient Vitals for the past 24 hrs:  BP Temp Temp src Pulse Resp SpO2 Height Weight  12/11/20 1930 132/68 98.2 F (36.8 C) Oral 92 16 -- -- --  12/11/20 1902 (!) 141/72 -- -- 93 16 -- -- --  12/11/20 1842 (!) 143/70 -- -- 75 16 -- -- --  12/11/20 1837 136/73 -- -- 78 16 -- -- --  12/11/20 1832 133/81 -- -- 78 16 -- -- --  12/11/20 1827 (!) 120/52 -- -- 82 16 -- -- --  12/11/20 1824 118/76 -- -- 88 16 -- -- --  12/11/20 1823 (!) 130/113 -- -- 91 18 -- -- --  12/11/20 1822 (!) 118/96 -- -- 73 18 -- -- --  12/11/20 1817 (!) 129/59 -- -- 87 18 -- -- --  12/11/20 1812 (!) 144/63 -- -- 83 20 -- -- --  12/11/20 1808 (!) 141/67 -- -- 83 18 -- -- --  12/11/20 1802 132/83 98.5 F (36.9 C) Oral (!) 105 16 -- -- --  12/11/20 1736 112/62 -- -- 87 16 -- -- --  12/11/20 1702 (!) 144/78 -- -- 79 20 -- -- --  12/11/20 1632 139/71 -- -- 76 20 -- -- --  12/11/20 1619 140/70 -- -- 78 16 -- -- --  12/11/20 1606 123/64 98.3 F (36.8 C) Oral 92 20 -- -- --  12/11/20 1600 -- -- -- -- -- 99 % -- --  12/11/20 1539 (!) 143/80 -- -- 94 18 -- -- --  12/11/20 1500 135/68 -- -- 87 18 -- -- --  12/11/20 1431 (!) 142/70 -- -- 80 -- -- -- --  12/11/20 1346 138/69 -- -- 80 -- -- -- --  12/11/20 1318 (!) 142/66 -- -- 77 -- -- -- --  12/11/20 1247 (!) 149/81 -- -- 85 16 -- -- --  12/11/20 1202 133/73 -- -- 78 -- -- -- --  12/11/20 1120 131/66 98.3 F (36.8 C) Oral 77 16 -- -- --  12/11/20 0802 -- -- -- -- 16 -- -- --  12/11/20 0629 128/73 98.2 F (36.8 C) Oral 88 16 -- -- --  12/11/20 0503 116/60 98.3 F (36.8 C) Oral 81 17 -- -- --  12/11/20 0200 126/73 -- -- 86 17 -- -- --  12/10/20 2305 120/75 -- -- 99 17 -- -- --  12/10/20 2110 108/65 98.2 F (36.8 C) Oral (!) 110 17 -- 5\' 6"  (1.676 m) 134 kg   A&ox3 Nml respirations Abd: soft, nt, gravid Cx: 5/70/-3, cephalic LE: tr edema, nt bilat  FHT: 130s, nml variablity, couple late decels  ,then few more but then resolved, couple variables after but then resolved; decels all about 45sec, back to baseline, decreased variability initialy then regular variability TOCO: q 1-2 min  A/P: iup at 38.6wga IOL - contin pitocin IOL, will contin plan for svd, though cautious; not yet in active labor; watching closely mVU closely Fetal status overall reassuring, will contin to monitor closely 3. Gbs neg  4. Uncertain time or ROM

## 2020-12-11 NOTE — Anesthesia Procedure Notes (Signed)
Epidural Patient location during procedure: OB Start time: 12/11/2020 5:50 PM End time: 12/11/2020 6:00 PM  Staffing Anesthesiologist: Marcene Duos, MD Performed: anesthesiologist   Preanesthetic Checklist Completed: patient identified, IV checked, site marked, risks and benefits discussed, surgical consent, monitors and equipment checked, pre-op evaluation and timeout performed  Epidural Patient position: sitting Prep: DuraPrep and site prepped and draped Patient monitoring: continuous pulse ox and blood pressure Approach: midline Location: L3-L4 Injection technique: LOR air  Needle:  Needle type: Tuohy  Needle gauge: 17 G Needle length: 9 cm and 9 Needle insertion depth: 9 cm Catheter type: closed end flexible Catheter size: 19 Gauge Catheter at skin depth: 15 cm Test dose: negative  Assessment Events: blood not aspirated, injection not painful, no injection resistance, no paresthesia and negative IV test

## 2020-12-11 NOTE — Progress Notes (Addendum)
Comfortable  Patient Vitals for the past 24 hrs:  BP Temp Temp src Pulse Resp SpO2  12/11/20 2220 118/74 -- -- 86 -- --  12/11/20 2136 126/68 -- -- (!) 104 -- --  12/11/20 2130 -- 98 F (36.7 C) Oral -- -- --  12/11/20 2000 136/64 -- -- 79 -- --  12/11/20 1930 132/68 98.2 F (36.8 C) Oral 92 16 --  12/11/20 1902 (!) 141/72 -- -- 93 16 --  12/11/20 1842 (!) 143/70 -- -- 75 16 --  12/11/20 1837 136/73 -- -- 78 16 --  12/11/20 1832 133/81 -- -- 78 16 --  12/11/20 1827 (!) 120/52 -- -- 82 16 --  12/11/20 1824 118/76 -- -- 88 16 --  12/11/20 1823 (!) 130/113 -- -- 91 18 --  12/11/20 1822 (!) 118/96 -- -- 73 18 --  12/11/20 1817 (!) 129/59 -- -- 87 18 --  12/11/20 1812 (!) 144/63 -- -- 83 20 --  12/11/20 1808 (!) 141/67 -- -- 83 18 --  12/11/20 1802 132/83 98.5 F (36.9 C) Oral (!) 105 16 --  12/11/20 1736 112/62 -- -- 87 16 --  12/11/20 1702 (!) 144/78 -- -- 79 20 --  12/11/20 1632 139/71 -- -- 76 20 --  12/11/20 1619 140/70 -- -- 78 16 --  12/11/20 1606 123/64 98.3 F (36.8 C) Oral 92 20 --  12/11/20 1600 -- -- -- -- -- 99 %  12/11/20 1539 (!) 143/80 -- -- 94 18 --  12/11/20 1500 135/68 -- -- 87 18 --  12/11/20 1431 (!) 142/70 -- -- 80 -- --  12/11/20 1346 138/69 -- -- 80 -- --  12/11/20 1318 (!) 142/66 -- -- 77 -- --  12/11/20 1247 (!) 149/81 -- -- 85 16 --  12/11/20 1202 133/73 -- -- 78 -- --  12/11/20 1120 131/66 98.3 F (36.8 C) Oral 77 16 --  12/11/20 0802 -- -- -- -- 16 --  12/11/20 0629 128/73 98.2 F (36.8 C) Oral 88 16 --  12/11/20 0503 116/60 98.3 F (36.8 C) Oral 81 17 --  12/11/20 0200 126/73 -- -- 86 17 --  12/10/20 2305 120/75 -- -- 99 17 --   A&ox3 Nml respirations Abd: soft, nt, gravid CX: 5-6/70/-3, caput noted LE: tr edema, nt bilat  FHT: 140s, nml variability, then early decels that are recurrent followed by late decels 1-2 minutes to 90s, then back to baseline but then reoccur, pitocin stopped TOCO: q 1-2 min  A/P: iup at 38.6wga NRFHT, remote  from delivery, fetal intolerance to labor.  Recurrent episodes of lated decels or prolonged decels during induction, now with deeper late decelerations and more freuqent w/o much cervical change.I have reviewed these findings with patient and family- at this time, recommend c/s for delivery.  Patient agrees, consent signed.  Understands risk of bleeding, infection, risk of injury to bowel, bladder, nerves, blood vessels, risk of anesthesia, risk of blood clot to legs/lungs.  Pitocin stopped.  Proceed to OR.

## 2020-12-11 NOTE — Progress Notes (Signed)
Comfortable with epidural, no c/o  Patient Vitals for the past 24 hrs:  BP Temp Temp src Pulse Resp Height Weight  12/11/20 1606 123/64 -- -- 92 -- -- --  12/11/20 1500 135/68 -- -- 87 18 -- --  12/11/20 1431 (!) 142/70 -- -- 80 -- -- --  12/11/20 1346 138/69 -- -- 80 -- -- --  12/11/20 1318 (!) 142/66 -- -- 77 -- -- --  12/11/20 1247 (!) 149/81 -- -- 85 16 -- --  12/11/20 1202 133/73 -- -- 78 -- -- --  12/11/20 1120 131/66 98.3 F (36.8 C) Oral 77 16 -- --  12/11/20 0802 -- -- -- -- 16 -- --  12/11/20 0629 128/73 98.2 F (36.8 C) Oral 88 16 -- --  12/11/20 0503 116/60 98.3 F (36.8 C) Oral 81 17 -- --  12/11/20 0200 126/73 -- -- 86 17 -- --  12/10/20 2305 120/75 -- -- 99 17 -- --  12/10/20 2110 108/65 98.2 F (36.8 C) Oral (!) 110 17 5\' 6"  (1.676 m) 134 kg   A&ox3 Nml respirations Abd: soft, nt, gravid Cx: 4-5/70/-3; attempt to arom but no membranes noted, no fluid noted; iupc and fse placed d/t difficulty picking up fht LE: tr edema, nt bilat  FHT: 130s, occ variable, +accels; 5 min decel with nadir to 80s then back to baseline, nml variability, +accels TOCO: q min  A/P: iup at 38.6 Oligohydramnios - contin IOL, s/p cytotec, now on pitocin; while in-office testing neg for SROM, afi wasd <5 and now appears pt has had ROM.  Pt unaware when this happened, admits to using bathroom today and wiping extra to get dry, no bleeding, gbs negative; IUPC and fse in place Fetal status overall reassuring - will begin amnioinfusion now and monitor FHT closely, position change did help with recovery of FHT, follow closely Bmi >40; last efw (at 35wga) 6'2" (64%), BPD 94%, HC 95% GBS neg RH pos Rubella Immune

## 2020-12-11 NOTE — Progress Notes (Signed)
Tammy Randall is a 25 y.o. G1P0 at [redacted]w[redacted]d by LMP admitted for induction of labor due to Low amniotic fluid..  Subjective: Crampy. Good FM  Objective: BP 116/60   Pulse 81   Temp 98.3 F (36.8 C) (Oral)   Resp 17   Ht 5\' 6"  (1.676 m)   Wt 134 kg   LMP 02/10/2020 (Exact Date)   BMI 47.69 kg/m  No intake/output data recorded. No intake/output data recorded.  FHT:  FHR: 150 bpm, variability: moderate,  accelerations:  Present,  decelerations:  Absent. Category 1 tracing UC:   irregular, every 4 minutes SVE:   Dilation: 1 Effacement (%): Thick Station: -3 Exam by:: [email or web address], RN Cytotec x 2  Labs: Lab Results  Component Value Date   WBC 11.5 (H) 12/10/2020   HGB 12.3 12/10/2020   HCT 37.8 12/10/2020   MCV 87.7 12/10/2020   PLT 249 12/10/2020    Assessment / Plan: IOL for mild idiopathic oligo  Labor: Progressing normally Preeclampsia:  no signs or symptoms of toxicity Fetal Wellbeing:  Category I Pain Control:  Labor support without medications I/D:  n/a Anticipated MOD:  NSVD  Christino Mcglinchey J 12/11/2020, 6:28 AM

## 2020-12-11 NOTE — Anesthesia Preprocedure Evaluation (Addendum)
Anesthesia Evaluation  Patient identified by MRN, date of birth, ID band Patient awake    Reviewed: Allergy & Precautions, Patient's Chart, lab work & pertinent test results  Airway Mallampati: III  TM Distance: >3 FB     Dental   Pulmonary asthma ,    Pulmonary exam normal        Cardiovascular negative cardio ROS Normal cardiovascular exam     Neuro/Psych  Headaches,    GI/Hepatic negative GI ROS, Neg liver ROS,   Endo/Other  Morbid obesity  Renal/GU negative Renal ROS     Musculoskeletal   Abdominal   Peds  Hematology negative hematology ROS (+)   Anesthesia Other Findings   Reproductive/Obstetrics (+) Pregnancy                             Anesthesia Physical Anesthesia Plan  ASA: 3 and emergent  Anesthesia Plan: Epidural   Post-op Pain Management:    Induction:   PONV Risk Score and Plan: Treatment may vary due to age or medical condition  Airway Management Planned: Natural Airway  Additional Equipment:   Intra-op Plan:   Post-operative Plan:   Informed Consent: I have reviewed the patients History and Physical, chart, labs and discussed the procedure including the risks, benefits and alternatives for the proposed anesthesia with the patient or authorized representative who has indicated his/her understanding and acceptance.       Plan Discussed with:   Anesthesia Plan Comments: (UPDATE 12/11/20 10:40 PM: Patient to go to OR for C section due to fetal intolerance to labor. Labor epidural in place and functioning well. Will plan to use epidural for surgical anesthesia. Discussed with patient. )       Anesthesia Quick Evaluation

## 2020-12-11 NOTE — Progress Notes (Signed)
Tammy Randall is a 25 y.o. G1P0 at [redacted]w[redacted]d by LMP admitted for induction of labor due to Low amniotic fluid..  Subjective: Crampy. Good FM  Objective: BP 128/73   Pulse 88   Temp 98.2 F (36.8 C) (Oral)   Resp 16   Ht 5\' 6"  (1.676 m)   Wt 134 kg   LMP 02/10/2020 (Exact Date)   BMI 47.69 kg/m  No intake/output data recorded. No intake/output data recorded.  FHT:  FHR: 150 bpm, variability: moderate,  accelerations:  Present,  decelerations:  Absent. Category 1 tracing UC:   irregular, every 4 minutes SVE:   Dilation: 1 Effacement (%): 70 Station: -2 Exam by:: Davis,RN Intracervical foley without difficulty. 60cc to insufflate bulb w/o difficulty  Labs: Lab Results  Component Value Date   WBC 11.5 (H) 12/10/2020   HGB 12.3 12/10/2020   HCT 37.8 12/10/2020   MCV 87.7 12/10/2020   PLT 249 12/10/2020    Assessment / Plan: IOL for mild idiopathic oligo  Labor: Progressing normally Preeclampsia:  no signs or symptoms of toxicity Fetal Wellbeing:  Category I Pain Control:  Labor support without medications I/D:  n/a Anticipated MOD:  NSVD  Tammy Randall J 12/11/2020, 11:02 AM

## 2020-12-12 ENCOUNTER — Encounter (HOSPITAL_COMMUNITY): Payer: Self-pay | Admitting: Obstetrics and Gynecology

## 2020-12-12 LAB — CBC
HCT: 32.6 % — ABNORMAL LOW (ref 36.0–46.0)
Hemoglobin: 11.1 g/dL — ABNORMAL LOW (ref 12.0–15.0)
MCH: 29.4 pg (ref 26.0–34.0)
MCHC: 34 g/dL (ref 30.0–36.0)
MCV: 86.2 fL (ref 80.0–100.0)
Platelets: 227 10*3/uL (ref 150–400)
RBC: 3.78 MIL/uL — ABNORMAL LOW (ref 3.87–5.11)
RDW: 14 % (ref 11.5–15.5)
WBC: 17.7 10*3/uL — ABNORMAL HIGH (ref 4.0–10.5)
nRBC: 0 % (ref 0.0–0.2)

## 2020-12-12 MED ORDER — OXYCODONE HCL 5 MG/5ML PO SOLN: 5.0000 mg | Freq: Once | ORAL | Status: DC | PRN

## 2020-12-12 MED ORDER — SENNOSIDES-DOCUSATE SODIUM 8.6-50 MG PO TABS
2.0000 | ORAL_TABLET | Freq: Every day | ORAL | Status: DC
  Administered 2020-12-13 – 2020-12-14 (×2): 2 via ORAL
  Filled 2020-12-12 (×2): qty 2

## 2020-12-12 MED ORDER — PRENATAL MULTIVITAMIN CH
1.0000 | ORAL_TABLET | Freq: Every day | ORAL | Status: DC
  Administered 2020-12-12 – 2020-12-14 (×3): 1 via ORAL
  Filled 2020-12-12 (×3): qty 1

## 2020-12-12 MED ORDER — OXYTOCIN-SODIUM CHLORIDE 30-0.9 UT/500ML-% IV SOLN
INTRAVENOUS | Status: AC
  Filled 2020-12-12: qty 500

## 2020-12-12 MED ORDER — FENTANYL CITRATE (PF) 100 MCG/2ML IJ SOLN
INTRAMUSCULAR | Status: DC | PRN
  Administered 2020-12-12 (×2): 25 ug via INTRAVENOUS

## 2020-12-12 MED ORDER — MEPERIDINE HCL 25 MG/ML IJ SOLN: 6.2500 mg | INTRAMUSCULAR | Status: DC | PRN

## 2020-12-12 MED ORDER — SODIUM CHLORIDE 0.9 % IR SOLN
Status: DC | PRN
  Administered 2020-12-12: 1

## 2020-12-12 MED ORDER — DIPHENHYDRAMINE HCL 25 MG PO CAPS: 25.0000 mg | ORAL_CAPSULE | Freq: Four times a day (QID) | ORAL | Status: DC | PRN

## 2020-12-12 MED ORDER — OXYCODONE HCL 5 MG PO TABS: 5.0000 mg | ORAL_TABLET | Freq: Once | ORAL | Status: DC | PRN

## 2020-12-12 MED ORDER — PROMETHAZINE HCL 25 MG/ML IJ SOLN: 6.2500 mg | INTRAMUSCULAR | Status: DC | PRN

## 2020-12-12 MED ORDER — DIPHENHYDRAMINE HCL 50 MG/ML IJ SOLN: 12.5000 mg | INTRAMUSCULAR | Status: DC | PRN

## 2020-12-12 MED ORDER — COCONUT OIL OIL: 1.0000 "application " | TOPICAL_OIL | Status: DC | PRN

## 2020-12-12 MED ORDER — SIMETHICONE 80 MG PO CHEW
80.0000 mg | CHEWABLE_TABLET | Freq: Three times a day (TID) | ORAL | Status: DC
  Administered 2020-12-12 – 2020-12-14 (×6): 80 mg via ORAL
  Filled 2020-12-12 (×6): qty 1

## 2020-12-12 MED ORDER — ACETAMINOPHEN 10 MG/ML IV SOLN
INTRAVENOUS | Status: AC
  Filled 2020-12-12: qty 100

## 2020-12-12 MED ORDER — STERILE WATER FOR IRRIGATION IR SOLN
Status: DC | PRN
  Administered 2020-12-12: 1

## 2020-12-12 MED ORDER — MENTHOL 3 MG MT LOZG: 1.0000 | LOZENGE | OROMUCOSAL | Status: DC | PRN

## 2020-12-12 MED ORDER — NALOXONE HCL 4 MG/10ML IJ SOLN
1.0000 ug/kg/h | INTRAVENOUS | Status: DC | PRN
  Filled 2020-12-12: qty 5

## 2020-12-12 MED ORDER — DEXMEDETOMIDINE (PRECEDEX) IN NS 20 MCG/5ML (4 MCG/ML) IV SYRINGE
PREFILLED_SYRINGE | INTRAVENOUS | Status: AC
  Filled 2020-12-12: qty 5

## 2020-12-12 MED ORDER — ACETAMINOPHEN 10 MG/ML IV SOLN
1000.0000 mg | Freq: Once | INTRAVENOUS | Status: DC | PRN
  Administered 2020-12-12: 1000 mg via INTRAVENOUS

## 2020-12-12 MED ORDER — KETOROLAC TROMETHAMINE 30 MG/ML IJ SOLN: 30.0000 mg | Freq: Four times a day (QID) | INTRAMUSCULAR | Status: AC | PRN

## 2020-12-12 MED ORDER — NALBUPHINE HCL 10 MG/ML IJ SOLN: 5.0000 mg | Freq: Once | INTRAMUSCULAR | Status: DC | PRN

## 2020-12-12 MED ORDER — NALBUPHINE HCL 10 MG/ML IJ SOLN: 5.0000 mg | INTRAMUSCULAR | Status: DC | PRN

## 2020-12-12 MED ORDER — NALBUPHINE HCL 10 MG/ML IJ SOLN
5.0000 mg | INTRAMUSCULAR | Status: DC | PRN
  Administered 2020-12-12 (×2): 5 mg via INTRAVENOUS
  Filled 2020-12-12 (×2): qty 1

## 2020-12-12 MED ORDER — KETOROLAC TROMETHAMINE 30 MG/ML IJ SOLN
30.0000 mg | Freq: Four times a day (QID) | INTRAMUSCULAR | Status: AC
  Filled 2020-12-12: qty 1

## 2020-12-12 MED ORDER — TETANUS-DIPHTH-ACELL PERTUSSIS 5-2.5-18.5 LF-MCG/0.5 IM SUSY
0.5000 mL | PREFILLED_SYRINGE | Freq: Once | INTRAMUSCULAR | Status: AC
  Administered 2020-12-14: 0.5 mL via INTRAMUSCULAR
  Filled 2020-12-12: qty 0.5

## 2020-12-12 MED ORDER — HYDROMORPHONE HCL 1 MG/ML IJ SOLN: 0.2500 mg | INTRAMUSCULAR | Status: DC | PRN

## 2020-12-12 MED ORDER — DIPHENHYDRAMINE HCL 25 MG PO CAPS: 25.0000 mg | ORAL_CAPSULE | ORAL | Status: DC | PRN

## 2020-12-12 MED ORDER — WITCH HAZEL-GLYCERIN EX PADS: 1.0000 "application " | MEDICATED_PAD | CUTANEOUS | Status: DC | PRN

## 2020-12-12 MED ORDER — NALOXONE HCL 0.4 MG/ML IJ SOLN: 0.4000 mg | INTRAMUSCULAR | Status: DC | PRN

## 2020-12-12 MED ORDER — CEFAZOLIN IN SODIUM CHLORIDE 3-0.9 GM/100ML-% IV SOLN
INTRAVENOUS | Status: AC
  Filled 2020-12-12: qty 100

## 2020-12-12 MED ORDER — LIDOCAINE HCL (PF) 1 % IJ SOLN
INTRAMUSCULAR | Status: DC | PRN
  Administered 2020-12-12: 20 mL

## 2020-12-12 MED ORDER — SCOPOLAMINE 1 MG/3DAYS TD PT72: 1.0000 | MEDICATED_PATCH | Freq: Once | TRANSDERMAL | Status: DC

## 2020-12-12 MED ORDER — OXYTOCIN-SODIUM CHLORIDE 30-0.9 UT/500ML-% IV SOLN
2.5000 [IU]/h | INTRAVENOUS | Status: AC
  Filled 2020-12-12: qty 500

## 2020-12-12 MED ORDER — SIMETHICONE 80 MG PO CHEW: 80.0000 mg | CHEWABLE_TABLET | ORAL | Status: DC | PRN

## 2020-12-12 MED ORDER — SODIUM CHLORIDE 0.9% FLUSH: 3.0000 mL | INTRAVENOUS | Status: DC | PRN

## 2020-12-12 MED ORDER — MORPHINE SULFATE (PF) 0.5 MG/ML IJ SOLN
INTRAMUSCULAR | Status: DC | PRN
  Administered 2020-12-12: 3 mg via EPIDURAL

## 2020-12-12 MED ORDER — DIBUCAINE (PERIANAL) 1 % EX OINT: 1.0000 "application " | TOPICAL_OINTMENT | CUTANEOUS | Status: DC | PRN

## 2020-12-12 MED ORDER — FENTANYL CITRATE (PF) 100 MCG/2ML IJ SOLN
INTRAMUSCULAR | Status: AC
  Filled 2020-12-12: qty 2

## 2020-12-12 MED ORDER — DEXMEDETOMIDINE (PRECEDEX) IN NS 20 MCG/5ML (4 MCG/ML) IV SYRINGE
PREFILLED_SYRINGE | INTRAVENOUS | Status: DC | PRN
  Administered 2020-12-12: 4 ug via INTRAVENOUS
  Administered 2020-12-12 (×2): 8 ug via INTRAVENOUS

## 2020-12-12 MED ORDER — IBUPROFEN 600 MG PO TABS
600.0000 mg | ORAL_TABLET | Freq: Four times a day (QID) | ORAL | Status: DC
  Administered 2020-12-12 – 2020-12-14 (×8): 600 mg via ORAL
  Filled 2020-12-12 (×8): qty 1

## 2020-12-12 MED ORDER — OXYCODONE HCL 5 MG PO TABS
5.0000 mg | ORAL_TABLET | ORAL | Status: DC | PRN
  Administered 2020-12-13 (×2): 5 mg via ORAL
  Filled 2020-12-12 (×2): qty 1

## 2020-12-12 MED ORDER — KETOROLAC TROMETHAMINE 30 MG/ML IJ SOLN
INTRAMUSCULAR | Status: AC
  Filled 2020-12-12: qty 1

## 2020-12-12 MED ORDER — LIDOCAINE HCL (PF) 1 % IJ SOLN
INTRAMUSCULAR | Status: AC
  Filled 2020-12-12: qty 30

## 2020-12-12 MED ORDER — ONDANSETRON HCL 4 MG/2ML IJ SOLN: 4.0000 mg | Freq: Three times a day (TID) | INTRAMUSCULAR | Status: DC | PRN

## 2020-12-12 NOTE — Lactation Note (Signed)
This note was copied from a baby's chart. Lactation Consultation Note  Patient Name: Tammy Randall Date: 12/12/2020 Reason for consult: Follow-up assessment;Mother's request;Difficult latch Age:24 hours P1, ETI female infant. Per mom, infant is still sleepy doesn't latch well, mostly holds breast in mouth doesn't elicit the SSB response.  Infant was given 7 mls of formula prior to [facility] entering the room. Mom latched infant on her left breast using the football hold position, infant suckle light less than 2 minutes, infant given additional 2 mls of colostrum by curve tip syringe. Mom fitted with 20 mm NS, infant has posterior lingual frenulum as well as thick labial frenulum which may be contribution to poor latch behaviors. Mom attempted re-latch infant with 20 mm NS, infant suckling more actively with swallows, due infant having 9 mls of formula infant not interested in further feeding at this time. Mom has not been using the DEBP, she concern she doesn't have colostrum, LC reviewed hand expression and colostrum present both breast. LC explained that colostrum is thick and importance of using DEBP is for breast stimulation and help establish milk supply, due infant being a poor latch at this time.  When LC left the room, dad was holding infant while mom was using the DEBP.  Mom's plan: 1- Mom will attempt to use 20 mm NS at next feeding and will ask RN/LC for assistance with latch if needed. 2-Afterwards mom will supplement infant with any EBM first and then formula based on infant's age/ hours Day 2 of life : 7-15 mls per feeding. 3- Mom will continue to pump every 3 hours for 15 minutes on initial setting.  Maternal Data    Feeding Mother's Current Feeding Choice: Breast Milk and Formula  LATCH Score Latch: Repeated attempts needed to sustain latch, nipple held in mouth throughout feeding, stimulation needed to elicit sucking reflex.  Audible Swallowing: A few with  stimulation  Type of Nipple: Everted at rest and after stimulation  Comfort (Breast/Nipple): Soft / non-tender  Hold (Positioning): Assistance needed to correctly position infant at breast and maintain latch.  LATCH Score: 7   Lactation Tools Discussed/Used    Interventions Interventions: Support pillows;Position options;Adjust position;Skin to skin;Hand express;Expressed milk;DEBP;Education  Discharge    Consult Status Consult Status: Follow-up Date: 12/13/20 Follow-up type: In-patient    Danelle Earthly 12/12/2020, 9:12 PM

## 2020-12-12 NOTE — Anesthesia Postprocedure Evaluation (Signed)
Anesthesia Post Note  Patient: Tammy Randall  Procedure(s) Performed: CESAREAN SECTION     Patient location during evaluation: PACU Anesthesia Type: Epidural Level of consciousness: oriented and awake and alert Pain management: pain level controlled Vital Signs Assessment: post-procedure vital signs reviewed and stable Respiratory status: spontaneous breathing, respiratory function stable and nonlabored ventilation Cardiovascular status: blood pressure returned to baseline and stable Postop Assessment: no headache, no backache, no apparent nausea or vomiting and epidural receding Anesthetic complications: no   No notable events documented.  Last Vitals:  Vitals:   12/12/20 0110 12/12/20 0115  BP:  (!) 131/95  Pulse:  91  Resp: 16 18  Temp: 36.8 C   SpO2:  100%    Last Pain:  Vitals:   12/12/20 0130  TempSrc:   PainSc: 5    Pain Goal:    LLE Motor Response: Purposeful movement (12/12/20 0130) LLE Sensation: Tingling (12/12/20 0130) RLE Motor Response: Purposeful movement (12/12/20 0130) RLE Sensation: Tingling (12/12/20 0130)     Epidural/Spinal Function Cutaneous sensation: Pins and Needles (12/12/20 0130), Patient able to flex knees: Yes (12/12/20 0130), Patient able to lift hips off bed: No (12/12/20 0130), Back pain beyond tenderness at insertion site: No (12/12/20 0130), Progressively worsening motor and/or sensory loss: No (12/12/20 0130), Bowel and/or bladder incontinence post epidural: No (12/12/20 0130)  Lucretia Kern

## 2020-12-12 NOTE — Transfer of Care (Signed)
Immediate Anesthesia Transfer of Care Note  Patient: Tammy Randall  Procedure(s) Performed: CESAREAN SECTION  Patient Location: PACU  Anesthesia Type:Epidural  Level of Consciousness: awake, alert  and patient cooperative  Airway & Oxygen Therapy: Patient Spontanous Breathing  Post-op Assessment: Report given to RN and Post -op Vital signs reviewed and stable  Post vital signs: Reviewed and stable  Last Vitals:  Vitals Value Taken Time  BP 131/77 12/12/20 0110  Temp    Pulse 91 12/12/20 0115  Resp 18 12/12/20 0115  SpO2 100 % 12/12/20 0115  Vitals shown include unvalidated device data.  Last Pain:  Vitals:   12/11/20 2130  TempSrc: Oral  PainSc:          Complications: No notable events documented.

## 2020-12-12 NOTE — Clinical Social Work Maternal (Signed)
CLINICAL SOCIAL WORK MATERNAL/CHILD NOTE  Patient Details  Name: Tammy Randall MRN: [id number] Date of Birth: [birth year]  Date:  12/12/2020  Clinical Social Worker Initiating Note:  Tammy Randall, [place] Date/Time: Initiated:  12/12/20/1210     Child's Name:  Tammy Randall   Biological Parents:  Mother, Father Tammy Randall 12-05-1996)   Need for Interpreter:  None   Reason for Referral:  Current Substance Use/Substance Use During Pregnancy     Address:  [address] [place] [address]    Phone number:  [phone] (home) [phone] (work)    Additional phone number:   Household Members/Support Persons (HM/SP):   Household Member/Support Person 1   HM/SP Name Relationship DOB or Age  HM/SP -1 Tammy Randall Significant Other 12-05-1996  HM/SP -2        HM/SP -3        HM/SP -4        HM/SP -5        HM/SP -6        HM/SP -7        HM/SP -8          Natural Supports (not living in the home):  Extended Family, Friends, Immediate Family, Parent   Professional Supports: None   Employment: Full-time   Type of Work: Psychiatric nurse   Education:  [place] arranged:    Museum/gallery curator Resources:  Multimedia programmer    Other Resources:  Physicist, medical  , [place] Considerations Which May Impact Care:    Strengths:  Ability to meet basic needs  , Home prepared for child  , Pediatrician chosen   Psychotropic Medications:         Pediatrician:    Careers adviser area  Pediatrician List:   Ecologist Other ([place])  [place]      Pediatrician Fax Number:    Risk Factors/Current Problems:  Substance Use     Cognitive State:  Able to Concentrate  , Insightful  , Alert  , Linear Thinking     Mood/Affect:  Bright  , Calm  , Comfortable  , Happy     CSW Assessment: CSW  received consult for THC use. CSW met with MOB to offer support and complete assessment.    CSW met with MOB at bedside and introduced CSW role. CSW congratulated MOB and FOB. CSW observed MOB sitting on the bed, FOB in chair and support person holding the infant on the couch. MOB presented happy and receptive to [place] visit. CSW offered MOB privacy and informed her about HIPPA, and medical information being shared. MOB gave CSW permission share all information with FOB and her support person present but requested information not be shared in front of mom. MOB confirmed the demographic information on hospital file is correct. MOB shared she and FOB Tammy Randall) live in the home together. CSW inquired how MOB has felt since giving birth. MOB shared feeling good. CSW inquired about MOB Substance use during pregnancy. MOB disclosed that she smoked marijuana twice during pregnancy due to sciatica pain in her hip. MOB denied using any other substance and shared the last time she used was 4 months ago. CSW informed MOB about the hospital drug screen policy. CSW made MOB aware if the infant's UDS or CDS resulted  positive then a report will be filed with CPS. MOB presented question about the process. CSW notified MOB about the process and that [place] will follow up. MOB reported understanding.  CSW inquired if MOB had history of mental health. MOB reported no history of mental health. CSW provided education regarding the baby blues period vs. perinatal mood disorders, discussed treatment and gave resources for mental health follow up if concerns arise.  CSW recommended MOB complete a self-evaluation during the postpartum time period using the New Mom Checklist from Postpartum Progress and encouraged MOB to contact a medical professional if symptoms are noted at any time. MOB reported she feels comfortable reaching out to her doctor if concerns arise. CSW assessed MOB for safety. MOB denied thoughts of harm  to self and others. CSW inquired about MOB supports. MOB identified her parents, in-laws, siblings, and extended family as supports.   CSW provided review of Sudden Infant Death Syndrome (SIDS) precautions. MOB reported the infant will have a car seat as of today, the infant will sleep in a bassinet and will have some diapers and wipes. CSW informed MOB about Manufacturing systems engineer. MOB gave CSW permission to make a referral. MOB has chosen [facility] in [place] for infant follow up care. MOB shared she has transportation to appointments. MOB reports she receives both WIC/FS benefits.   Upon CSW exiting, [employer] rep, arrived to speak with MOB and FOB.   -Csw will follow infant's UDS, CDS and make a report to CPS, if warranted.  CSW identifies no further need for intervention and no barriers to discharge at this time.    CSW Plan/Description:  CSW Will Continue to Monitor Umbilical Cord Tissue Drug Screen Results and Make Report if Warranted, Sudden Infant Death Syndrome (SIDS) Education, Other Information/Referral to [place], Perinatal Mood and Anxiety Disorder (PMADs) Education, No Further Intervention Required/No Barriers to Discharge    Tammy Hopping, LCSW 12/12/2020, 12:45 PM

## 2020-12-12 NOTE — Progress Notes (Signed)
   Subjective: POD# 1 Live born female  Birth Weight: 6 lb 12.6 oz (3080 g) APGAR: 8, 9  Newborn Delivery   Birth date/time: 12/11/2020 23:46:00 Delivery type: C-Section, Low Transverse Trial of labor: Yes C-section categorization: Primary     Baby name: Gladstone Pih Delivering provider: Rhoderick Moody E   circumcision planned inpatient Feeding: breast  Pain control at delivery: Epidural   Reports feeling very well. Sitting in bed w/ family at [facility].   Patient reports tolerating PO.   Breast symptoms:+ colostrum Pain controlled with  APAP and NSAID Denies HA/SOB/C/P/N/V/dizziness. Flatus absent. She reports vaginal bleeding as normal, without clots.  She has ambulated to BR for pericare, foley cath still in place, draining clear yellow urine.    Objective:   VS:    Vitals:   12/12/20 0325 12/12/20 0435 12/12/20 0540 12/12/20 0913  BP: 126/60 133/69 120/66   Pulse: 83 91 81   Resp: 18 18 18    Temp: 98.8 F (37.1 C) 98.3 F (36.8 C) 98.1 F (36.7 C)   TempSrc: Oral Oral Oral   SpO2: 100% 100% 100% 97%  Weight:      Height:         Intake/Output Summary (Last 24 hours) at 12/12/2020 12/14/2020 Last data filed at 12/12/2020 0600 Gross per 24 hour  Intake 4959.2 ml  Output 2806 ml  Net 2153.2 ml        Recent Labs    12/10/20 2147 12/12/20 0646  WBC 11.5* 17.7*  HGB 12.3 11.1*  HCT 37.8 32.6*  PLT 249 227     Blood type: --/--/O POS (09/19 2147)  Rubella: Immune (03/04 0000)    Physical Exam:  General: alert, cooperative, and no distress CV: Regular rate and rhythm Resp: clear Abdomen: soft, nontender, normal bowel sounds Incision: clean, dry, intact, and Prevena patent  Uterine Fundus: firm, below umbilicus, nontender Lochia: minimal Ext: no edema, redness or tenderness in the calves or thighs  Assessment/Plan: 25 y.o.   POD# 1. G1P1001                  Principal Problem:   Postpartum care following cesarean delivery 9/20 Active Problems:   Indication for  care in labor or delivery   Cesarean delivery - FITL   Morbid obesity (HCC) / BMI > 45   Doing well, stable.               Advance diet as tolerated Encourage rest when baby rests Breastfeeding support Encourage to ambulate Routine post-op care  10/20, CNM, MSN 12/12/2020, 9:22 AM

## 2020-12-12 NOTE — Lactation Note (Signed)
This note was copied from a baby's chart. Lactation Consultation Note  Patient Name: Tammy Randall BDZHG'D Date: 12/12/2020 Reason for consult: Initial assessment;1st time breastfeeding;Early term 37-38.6wks (C/S delivery) Age:24 hours LC entered the room, infant swaddled in blankets in bassinet. Per dad, infant attempted to latch in Recovery but after few sucks infant fell asleep. Mom attempted to latch infant on her right breast using the football hold position, infant latched with depth but only held nipple in mouth, did not elicit the SSB response  at this time.  Mom was taught hand expression and infant was given 3 mls of colostrum by spoon.  Mom will continue to work towards latching infant at the breast and will ask RN/LC for further assistance with latching infant at the breast.  Mom knows to breastfeed infant according to hunger cues, 8 to 12+ times within 24 hours, skin to skin. Mom knows to hand express and give infant back EBM by spoon if infant doesn't latch and to continue to do lots of skin to skin. Mom made aware of O/P services, breastfeeding support groups, community resources, and our phone # for post-discharge questions.   Maternal Data Has patient been taught Hand Expression?: Yes Does the patient have breastfeeding experience prior to this delivery?: No  Feeding Mother's Current Feeding Choice: Breast Milk and Formula  LATCH Score Latch: Too sleepy or reluctant, no latch achieved, no sucking elicited.  Audible Swallowing: None  Type of Nipple: Everted at rest and after stimulation  Comfort (Breast/Nipple): Soft / non-tender  Hold (Positioning): Assistance needed to correctly position infant at breast and maintain latch.  LATCH Score: 5   Lactation Tools Discussed/Used    Interventions Interventions: Breast feeding basics reviewed;Assisted with latch;Adjust position;Breast compression;Support pillows;Skin to skin;Breast massage;Position options;Hand  express;Expressed milk;Education  Discharge Pump: Personal WIC Program: Yes  Consult Status Consult Status: Follow-up Date: 12/12/20 Follow-up type: In-patient    Danelle Earthly 12/12/2020, 3:24 AM

## 2020-12-12 NOTE — Lactation Note (Signed)
This note was copied from a baby's chart. Lactation Consultation Note  Patient Name: Tammy Randall TIWPY'K Date: 12/12/2020 Reason for consult: Follow-up assessment Age:24 hours  P1, Called to room to assist with latching. Mother can easily express drops.  Gave baby drops on spoon. Noted with oral assessment that infant has tigh mid posterior lingual frenulum and thick labial frenulum.   Suggest discussing with Ped MD.  Assisted with latching in both football and cross cradle. Baby latched for approx 5 min withi guidance. Suggest mother post pump with DEBP for 15 min.  Maternal Data Has patient been taught Hand Expression?: Yes Does the patient have breastfeeding experience prior to this delivery?: No  Feeding Mother's Current Feeding Choice: Breast Milk and Formula  LATCH Score Latch: Repeated attempts needed to sustain latch, nipple held in mouth throughout feeding, stimulation needed to elicit sucking reflex.  Audible Swallowing: A few with stimulation  Type of Nipple: Everted at rest and after stimulation  Comfort (Breast/Nipple): Soft / non-tender  Hold (Positioning): Assistance needed to correctly position infant at breast and maintain latch.  LATCH Score: 7   Lactation Tools Discussed/Used Tools: Pump Breast pump type: Double-Electric Breast Pump  Interventions Interventions: Breast feeding basics reviewed;Assisted with latch;Skin to skin;Hand express;Education  Discharge Pump: Personal;DEBP  Consult Status Consult Status: Follow-up Date: 12/12/20 Follow-up type: In-patient    [name] 12/12/2020, 8:31 AM

## 2020-12-12 NOTE — Op Note (Signed)
Cesarean Section Procedure Note   [name]  12/10/2020 - 12/12/2020  Indications: Fetal Distress   Pre-operative Diagnosis: fetal intolerance to labor, bmi >40, covid in pregnancy; oligohydramnios PROCEDURE: Primary low transverse Cesarean Section   Post-operative Diagnosis: Same   Surgeon: Surgeon(s) and Role:    * Tyquon Near, Candace Gallus, MD - Primary   Assistants: Arlan Organ, CNM  Anesthesia: epidural   Procedure Details:  The patient was seen in the labor  Room and reason for c/s reviewd. The risks, benefits, complications, treatment options, and expected outcomes were discussed with the patient. The patient concurred with the proposed plan, giving informed consent. identified as Tammy Randall and the procedure verified as C-Section Delivery. A Time Out was held and the above information confirmed.  After induction of anesthesia, the patient was draped and prepped in the usual sterile manner, foley was draining urine well.  A pfannenstiel incision was made and carried down through the subcutaneous tissue to the fascia. Fascial incision was made and extended transversely. The fascia was separated from the underlying rectus tissue superiorly and inferiorly. The peritoneum was identified and entered. Peritoneal incision was extended longitudinally. Alexis-O retractor placed. The utero-vesical peritoneal reflection was incised transversely and the bladder flap was bluntly freed from the lower uterine segment. A low transverse uterine incision was made. Delivered from cephalic presentation was a viable female infant with vigorous cry. Apgar scores of 8 at one minute and 9 at five minutes. Delayed cord clamping done at 1 minute and baby handed to NICU team in attendance. Cord ph was not sent. Cord blood was obtained for evaluation. The placenta was removed Intact, spontaneously and appeared normal. The uterine outline, tubes and ovaries appeared normal}. The uterine incision was closed with  running locked sutures of 0Vicryl. A second imbricating layer sutured.   Hemostasis was observed. Alexis retractor removed. Peritoneal closure done with 2-0 Vicryl.  The fascia was then reapproximated with running sutures of 0Vicryl. The subcuticular closure was performed using 2-0plain gut. The skin was closed with 4-0Vicryl.   Instrument, sponge, and needle counts were correct prior the abdominal closure and were correct at the conclusion of the case.    Findings:viable female infant, apgars 8/9; normal tubes/ovaries bilaterally, uterus grossly wnl   Estimated Blood Loss:   Total IV Fluids:   Urine Output:  150CC OF clear urine  Specimens: none   Complications: no complications  Disposition: PACU - hemodynamically stable.   Maternal Condition: stable   Baby condition / location:  Couplet care / Skin to Skin  Attending Attestation: I performed the procedure.   Signed: Surgeon(s): Amado Nash Candace Gallus, MD

## 2020-12-13 NOTE — Lactation Note (Signed)
This note was copied from a baby's chart. Lactation Consultation Note  Patient Name: Tammy Randall XTAVW'P Date: 12/13/2020 Reason for consult: Follow-up assessment Age:24 hours  LC in to room for follow up. Mother is attempting to bottlefeed "Gladstone Pih" upon arrival. Mother states it has been difficult to latch. LC assisted with latch and reinforced deep latch, optimal alignment and support.  Infant is clusterfeeding. "Gladstone Pih" took additional 19 mL of formula.  Mother has a DEBP set up but reports not using it yet.   Plan: 1-Breastfeeding on demand or 8-12 times in 24h period. 2-Keep infant awake during breastfeeding session: massaging breast, infant's hand/shoulder/feet 3-Expressed breast milk or formula to "top infant up" following guidelines 4-Reinforce maternal rest, hydration and food intake.   Contact LC as needed for feeds/support/concerns/questions. All questions answered at this time.    Maternal Data Has patient been taught Hand Expression?: Yes Does the patient have breastfeeding experience prior to this delivery?: No  Feeding Mother's Current Feeding Choice: Breast Milk and Formula  LATCH Score Latch: Grasps breast easily, tongue down, lips flanged, rhythmical sucking.  Audible Swallowing: Spontaneous and intermittent  Type of Nipple: Everted at rest and after stimulation  Comfort (Breast/Nipple): Soft / non-tender  Hold (Positioning): Assistance needed to correctly position infant at breast and maintain latch.  LATCH Score: 9   Lactation Tools Discussed/Used Tools: Pump Breast pump type: Double-Electric Breast Pump  Interventions Interventions: Breast feeding basics reviewed;Assisted with latch;Skin to skin;Breast massage;Hand express;Breast compression;Adjust position;Support pillows;Pace feeding;Education;Expressed milk;DEBP  Discharge Discharge Education: Engorgement and breast care Pump: Personal;Manual;DEBP  Consult Status Consult Status:  Follow-up Date: 12/14/20 Follow-up type: In-patient    Vick Filter A Higuera Ancidey 12/13/2020, 9:19 PM

## 2020-12-13 NOTE — Progress Notes (Signed)
CSW received and acknowledges consult for EDPS of 10. CSW screening our referral, MOB was seen by CSW Tammy Randall) on 12/12/20 and provided with education regarding the baby blues period vs. perinatal mood disorders, discussed treatment and gave resources for mental health follow up if concerns arise.  CSW recommends self-evaluation during the postpartum time period using the New Mom Checklist from Postpartum Progress and encouraged MOB to contact a medical professional if symptoms are noted at any time.    CSW identifies no further need for intervention and no barriers to discharge at this time.  Celso Sickle, LCSW Clinical Social Worker [facility] Cell#: [phone]

## 2020-12-13 NOTE — Progress Notes (Signed)
  POD# 2 Live born female  Birth Weight: 6 lb 12.6 oz (3080 g) APGAR: 8, 9  Newborn Delivery   Birth date/time: 12/11/2020 23:46:00 Delivery type: C-Section, Low Transverse Trial of labor: Yes C-section categorization: Primary     Baby name: Gladstone Pih Delivering provider: Rhoderick Moody E   circumcision planned inpatient- consents and plan later this morning  Feeding: breast  Pain control at delivery: Epidural   Reports feeling very well. Sitting in bed w/ family at [facility].   Patient reports tolerating PO.   Breast symptoms:+ colostrum Pain controlled with  APAP and NSAID Denies HA/SOB/C/P/N/V/dizziness. Flatus absent. She reports vaginal bleeding as normal, without clots.  She has ambulated to BR for pericare, foley cath still in place, draining clear yellow urine.    Objective:   VS:    Vitals:   12/12/20 1440 12/12/20 1842 12/12/20 1950 12/13/20 0453  BP: (!) 124/56 132/76 128/71 (!) 116/58  Pulse: (!) 103 100 (!) 109 85  Resp: 17 20 18 18   Temp: 98.4 F (36.9 C) 98.2 F (36.8 C) 98.3 F (36.8 C) 97.7 F (36.5 C)  TempSrc: Oral Oral Oral Oral  SpO2: 99% 100% 100% 100%  Weight:      Height:         Intake/Output Summary (Last 24 hours) at 12/13/2020 1156 Last data filed at 12/12/2020 2139 Gross per 24 hour  Intake --  Output 750 ml  Net -750 ml         Recent Labs    12/10/20 2147 12/12/20 0646  WBC 11.5* 17.7*  HGB 12.3 11.1*  HCT 37.8 32.6*  PLT 249 227      Blood type: --/--/O POS (09/19 2147)  Rubella: Immune (03/04 0000)    Physical Exam:  General: alert, cooperative, and no distress CV: Regular rate and rhythm Resp: clear Abdomen: soft, nontender, normal bowel sounds Incision: clean, dry, intact, and Prevena patent  Uterine Fundus: firm, below umbilicus, nontender Lochia: minimal Ext: no edema, redness or tenderness in the calves or thighs  Assessment/Plan: 25 y.o.   POD# 2. 25  Emergency C/s for fetal intolerance to labor. IOL for  Oligohydramnios BMI >45 Pravena dressing  Doing well, stable.              Advance diet as tolerated Encourage rest when baby rests Breastfeeding support Encourage to ambulate Routine post-op care   Anticipate DC tomorrow   --[id number] MD

## 2020-12-14 MED ORDER — COCONUT OIL OIL: 1.0000 "application " | TOPICAL_OIL | 0 refills | Status: DC | PRN

## 2020-12-14 MED ORDER — IBUPROFEN 600 MG PO TABS
600.0000 mg | ORAL_TABLET | Freq: Four times a day (QID) | ORAL | 0 refills | Status: DC
Start: 2020-12-14 — End: 2022-01-05

## 2020-12-14 MED ORDER — SENNOSIDES-DOCUSATE SODIUM 8.6-50 MG PO TABS: 2.0000 | ORAL_TABLET | Freq: Every evening | ORAL | 0 refills | Status: DC | PRN

## 2020-12-14 MED ORDER — OXYCODONE HCL 5 MG PO TABS: 5.0000 mg | ORAL_TABLET | Freq: Four times a day (QID) | ORAL | 0 refills | Status: DC | PRN

## 2020-12-14 NOTE — Discharge Summary (Signed)
Postpartum Discharge Summary  Date of Service updated 12/14/20     Patient Name: Tammy Randall DOB: [birth year] MRN: [id number]  Date of admission: 12/10/2020 Delivery date:12/11/2020  Delivering provider: Tiana Loft E  Date of discharge: 12/14/2020  Admitting diagnosis: Indication for care in labor or delivery [O75.9] Intrauterine pregnancy: [redacted]w[redacted]d    Secondary diagnosis:  Principal Problem:   Postpartum care following cesarean delivery 9/20 Active Problems:   Indication for care in labor or delivery   Cesarean delivery - FITL   Morbid obesity ([place] / BMI > 45  Additional problems: Oligohydramnios, Covid during pregnancy, mild infection,     Discharge diagnosis: Term Pregnancy Delivered                                              Post partum procedures: Tdap given  Augmentation: Pitocin and Cytotec Complications: None  Hospital course: Induction of Labor With Unplanned C/S   25y.o. yo G1P1001 at 319w6das admitted in for induction of labor for oligohydramnios on 12/10/2020. Patient had a labor course significant for induction/augmentation with Cytotec and Pitocin. The patient went for cesarean section due to Non-Reassuring FHR. Delivery details as follows: Membrane Rupture Time/Date: 6:18 AM ,12/11/2020   Delivery Method:C-Section, Low Transverse  Details of operation can be found in separate operative note. Patient had an uncomplicated postpartum course.  She is ambulating,tolerating a regular diet, passing flatus, and urinating well.  Patient is discharged home in stable condition 12/14/20.  Newborn Data: Birth date:12/11/2020  Birth time:11:46 PM  Gender:Female  Living status:Living  Apgars:8 ,9  Weight:3080 g  "ElDewain Penning  Magnesium Sulfate received: No BMZ received: No Rhophylac:N/A MMR:N/A T-DaP:Given postpartum Flu: No declines until later date COVID: declines Transfusion:No  Physical exam  Vitals:   12/13/20 0453 12/13/20 1558 12/13/20 2117 12/14/20  0600  BP: (!) 116/58 (!) 115/53 126/66 125/65  Pulse: 85 79 91 96  Resp: _0 Temp: 97.7 F (36.5 C) 97.8 F (36.6 C) 97.7 F (36.5 C) 98 F (36.7 C)  TempSrc: Oral Axillary Oral Oral  SpO2: 100%  100% 100%  Weight:      Height:       General: alert, cooperative, and no distress Heart: RRR Lungs: CTA Lochia: appropriate Abdomen: obese, active bowel sounds in all quadrants  Uterine Fundus: firm, below umbilicus Incision: Healing well with no significant drainage DVT Evaluation: No evidence of DVT seen on physical exam. Negative Homan's sign. No cords or calf tenderness. No significant calf/ankle edema. Labs: Lab Results  Component Value Date   WBC 17.7 (H) 12/12/2020   HGB 11.1 (L) 12/12/2020   HCT 32.6 (L) 12/12/2020   MCV 86.2 12/12/2020   PLT 227 12/12/2020   CMP Latest Ref Rng & Units 07/07/2019  Glucose 65 - 99 mg/dL 79  BUN 6 - 20 mg/dL 6  Creatinine 0.57 - 1.00 mg/dL 0.53(L)  Sodium 134 - 144 mmol/L 141  Potassium 3.5 - 5.2 mmol/L 4.3  Chloride 96 - 106 mmol/L 107(H)  CO2 20 - 29 mmol/L 21  Calcium 8.7 - 10.2 mg/dL 8.9  Total Protein 6.0 - 8.5 g/dL 6.7  Total Bilirubin 0.0 - 1.2 mg/dL 0.3  Alkaline Phos 39 - 117 IU/L 66  AST 0 - 40 IU/L 13  ALT 0 - 32 IU/L 8   Edinburgh Score: [place]  Postnatal Depression Scale Screening Tool 12/12/2020  I have been able to laugh and see the funny side of things. 0  I have looked forward with enjoyment to things. 0  I have blamed myself unnecessarily when things went wrong. 2  I have been anxious or worried for no good reason. 2  I have felt scared or panicky for no good reason. 0  Things have been getting on top of me. 2  I have been so unhappy that I have had difficulty sleeping. 2  I have felt sad or miserable. 1  I have been so unhappy that I have been crying. 1  The thought of harming myself has occurred to me. 0  Edinburgh Postnatal Depression Scale Total 10      After visit meds:  Allergies as of  12/14/2020   No Known Allergies      Medication List     STOP taking these medications    fluconazole 150 MG tablet Commonly known as: Diflucan   metroNIDAZOLE 500 MG tablet Commonly known as: Flagyl   nystatin cream Commonly known as: MYCOSTATIN       TAKE these medications    coconut oil Oil Apply 1 application topically as needed.   ibuprofen 600 MG tablet Commonly known as: ADVIL Take 1 tablet (600 mg total) by mouth every 6 (six) hours.   oxyCODONE 5 MG immediate release tablet Commonly known as: Oxy IR/ROXICODONE Take 1 tablet (5 mg total) by mouth every 6 (six) hours as needed for moderate pain.   prenatal multivitamin Tabs tablet Take 1 tablet by mouth daily at 12 noon.   senna-docusate 8.6-50 MG tablet Commonly known as: Senokot-S Take 2 tablets by mouth at bedtime as needed for mild constipation.         Discharge home in stable condition Infant Feeding: Bottle and Breast Infant Disposition:rooming in while baby is on phototherapy  Discharge instruction: per After Visit Summary and Postpartum booklet. Activity: Advance as tolerated. Pelvic rest for 6 weeks.  Diet: low salt diet Anticipated Birth Control: Unsure Postpartum Appointment:6 weeks Additional Postpartum F/U:  Prevena wound removal on 12/17/20 ; EPDS f/u Future Appointments:No future appointments. Follow up Visit:  Follow-up Information     Murrell Redden Earlyne Iba, MD Follow up in 6 week(s).   Specialty: Obstetrics and Gynecology Why: Postpartum visit Contact information: [place]  [id number] [phone]                     12/14/2020 Darliss Cheney, CNM

## 2020-12-14 NOTE — Discharge Instructions (Signed)
Make appointment on 12/17/20 for Prevena wound dressing removal

## 2020-12-14 NOTE — Lactation Note (Signed)
This note was copied from a baby's chart. Lactation Consultation Note  Patient Name: Tammy Randall Date: 12/14/2020 Reason for consult: Follow-up assessment;Primapara;Difficult latch;Hyperbilirubinemia Age:24 hours   Mom states baby is latching okay.  She has pumped a couple of times but not consistently.    LC assisted with latching and infant was positioned in football hold: STS.  Mom is able to hand express independently.  Infant latched but had long pauses.  Recently had 10ml formula.  With massage and compression, deep jaw excursions noted and swallows were heard. Infant had wide gape with flanged lips and sustained latch easily.  Stimulation needed to keep infant actively sucking.   LC observed pumping since mom states it was painful.  More comfort was felt with size 24.  Mom pumped 56ml and was excited.  Plan: BF baby with cues.  Keep infant awake during feeding so he's actively sucking.  When supplementing,use your pumped EBM then formula.   Pump every 3 hours. Maternal Data Has patient been taught Hand Expression?: Yes  Feeding Mother's Current Feeding Choice: Breast Milk and Formula  LATCH Score Latch: Grasps breast easily, tongue down, lips flanged, rhythmical sucking.  Audible Swallowing: A few with stimulation  Type of Nipple: Everted at rest and after stimulation  Comfort (Breast/Nipple): Filling, red/small blisters or bruises, mild/mod discomfort (slight pinching noted after several minutes but mom felt latching has improved greatly)  Hold (Positioning): Assistance needed to correctly position infant at breast and maintain latch.  LATCH Score: 7   Lactation Tools Discussed/Used Tools: Flanges;Pump Flange Size: 24 (mom states pumping hurt.  LC observed pumping/ switched mom to size 24 for a more comfortable fit) Breast pump type: Double-Electric Breast Pump Pump Education: Setup, frequency, and cleaning;Milk Storage Reason for Pumping: stimulate  milk supply/ difficult latch/ increased bili Pumping frequency: encouraged every 3 hours Pumped volume: 6 mL  Interventions Interventions: Breast feeding basics reviewed;Assisted with latch;Skin to skin;Breast massage;Position options;Support pillows;DEBP  Discharge Pump: DEBP  Consult Status Consult Status: Follow-up Date: 12/15/20 Follow-up type: In-patient    Tammy Randall [facility] 12/14/2020, 8:26 AM

## 2020-12-20 ENCOUNTER — Inpatient Hospital Stay (HOSPITAL_COMMUNITY): Payer: BC Managed Care – PPO

## 2020-12-24 ENCOUNTER — Telehealth (HOSPITAL_COMMUNITY): Payer: Self-pay | Admitting: *Deleted

## 2020-12-24 NOTE — Telephone Encounter (Signed)
Mom reports feeling great! Incision is healing well. Checkup with OB last week. No concerns about herself. EPDS=1 (hospital score=10) Mom reports baby is well. Feeding, peeing, and pooping without difficulty. Sleeps in bassinet on back. Reviewed ABC's of safe sleep. Mom has no concerns about baby.  Duffy Rhody, RN 12-24-2020 at 1:16pm

## 2021-10-23 LAB — OB RESULTS CONSOLE HIV ANTIBODY (ROUTINE TESTING): HIV: NONREACTIVE

## 2021-10-23 LAB — OB RESULTS CONSOLE ABO/RH: RH Type: POSITIVE

## 2021-10-23 LAB — OB RESULTS CONSOLE RPR: RPR: NONREACTIVE

## 2021-10-23 LAB — HEPATITIS C ANTIBODY: HCV Ab: NEGATIVE

## 2021-10-23 LAB — OB RESULTS CONSOLE HEPATITIS B SURFACE ANTIGEN: Hepatitis B Surface Ag: NEGATIVE

## 2021-10-23 LAB — OB RESULTS CONSOLE ANTIBODY SCREEN: Antibody Screen: NEGATIVE

## 2021-10-23 LAB — OB RESULTS CONSOLE RUBELLA ANTIBODY, IGM: Rubella: IMMUNE

## 2022-01-04 ENCOUNTER — Inpatient Hospital Stay (HOSPITAL_COMMUNITY)
Admission: AD | Admit: 2022-01-04 | Discharge: 2022-01-05 | Disposition: A | Discharge status: Home / Self Care | Payer: BC Managed Care – PPO | Attending: Obstetrics and Gynecology | Admitting: Obstetrics and Gynecology

## 2022-01-04 ENCOUNTER — Inpatient Hospital Stay (HOSPITAL_BASED_OUTPATIENT_CLINIC_OR_DEPARTMENT_OTHER): Payer: BC Managed Care – PPO

## 2022-01-04 ENCOUNTER — Other Ambulatory Visit: Payer: Self-pay

## 2022-01-04 ENCOUNTER — Encounter (HOSPITAL_COMMUNITY): Payer: Self-pay

## 2022-01-04 DIAGNOSIS — Z3A26 26 weeks gestation of pregnancy: Secondary | ICD-10-CM | POA: Diagnosis not present

## 2022-01-04 DIAGNOSIS — O4692 Antepartum hemorrhage, unspecified, second trimester: Secondary | ICD-10-CM

## 2022-01-04 DIAGNOSIS — Z6790 Unspecified blood type, Rh positive: Secondary | ICD-10-CM | POA: Diagnosis not present

## 2022-01-04 DIAGNOSIS — O26852 Spotting complicating pregnancy, second trimester: Secondary | ICD-10-CM | POA: Insufficient documentation

## 2022-01-04 DIAGNOSIS — Z3A27 27 weeks gestation of pregnancy: Secondary | ICD-10-CM | POA: Diagnosis not present

## 2022-01-04 DIAGNOSIS — Z3689 Encounter for other specified antenatal screening: Secondary | ICD-10-CM

## 2022-01-04 LAB — URINALYSIS, ROUTINE W REFLEX MICROSCOPIC
Bilirubin Urine: NEGATIVE
Glucose, UA: NEGATIVE mg/dL
Hgb urine dipstick: NEGATIVE
Ketones, ur: 20 mg/dL — AB
Leukocytes,Ua: NEGATIVE
Nitrite: NEGATIVE
Protein, ur: NEGATIVE mg/dL
Specific Gravity, Urine: 1.015 (ref 1.005–1.030)
pH: 5 (ref 5.0–8.0)

## 2022-01-04 LAB — CBC
HCT: 33.2 % — ABNORMAL LOW (ref 36.0–46.0)
Hemoglobin: 11.6 g/dL — ABNORMAL LOW (ref 12.0–15.0)
MCH: 29.2 pg (ref 26.0–34.0)
MCHC: 34.9 g/dL (ref 30.0–36.0)
MCV: 83.6 fL (ref 80.0–100.0)
Platelets: 226 10*3/uL (ref 150–400)
RBC: 3.97 MIL/uL (ref 3.87–5.11)
RDW: 14 % (ref 11.5–15.5)
WBC: 13 10*3/uL — ABNORMAL HIGH (ref 4.0–10.5)
nRBC: 0 % (ref 0.0–0.2)

## 2022-01-04 LAB — WET PREP, GENITAL
Clue Cells Wet Prep HPF POC: NONE SEEN
Sperm: NONE SEEN
Trich, Wet Prep: NONE SEEN
WBC, Wet Prep HPF POC: <10 (ref <10)
Yeast Wet Prep HPF POC: NONE SEEN

## 2022-01-04 NOTE — ED Notes (Signed)
Report given to MAU RN Caryl Pina

## 2022-01-04 NOTE — MAU Provider Note (Signed)
History     CSN: [id number]  Arrival date and time: 01/04/22 2015   Event Date/Time   First Provider Initiated Contact with Patient 01/04/22 2217      Chief Complaint  Patient presents with   Abdominal Pain   Vaginal Bleeding   HPI Tammy Randall is a 26 y.o. G2P1001 at [redacted]w[redacted]d who presents to MAU from [facility] via POV. Chief complaint is vaginal spotting. This is a new problem, onset today 01/04/2022. Patient reports seeing dark red blood when she wiped after voiding around 0545. She visualized pink spotting around noon. She denies ongoing spotting. She has not donned a pad and is not seeing blood on her clothing.   Patient also reports lower abdominal cramping "like my cycle". Pain score is 3/10. She has not taken medication for this complaint and declines medication during MAU encounter. She denies contractions, leaking of fluid, abnormal vaginal discharge, dysuria, fever or recent illness. She is remote from sexual intercourse.  Patient receives care with [facility].  OB History     Gravida  2   Para  1   Term  1   Preterm      AB      Living  1      SAB      IAB      Ectopic      Multiple  0   Live Births  1           Past Medical History:  Diagnosis Date   Asthma    CP (cerebral palsy) (HCC)    Migraine     Past Surgical History:  Procedure Laterality Date   CESAREAN SECTION N/A 12/11/2020   Procedure: CESAREAN SECTION;  Surgeon: Vick Frees, MD;  Location: [facility] LD ORS;  Service: Obstetrics;  Laterality: N/A;    Family History  Problem Relation Age of Onset   Diabetes Mother    Hypertension Mother    Diabetes Maternal Grandmother    Hypertension Maternal Grandmother    High Cholesterol Maternal Grandmother    CVA Maternal Grandfather     Social History   Tobacco Use   Smoking status: Never   Smokeless tobacco: Never  Vaping Use   Vaping Use: Never used  Substance Use Topics   Alcohol use: No   Drug use: Not Currently     Types: Marijuana    Comment: a month ago as of 12/10/2020    Allergies: No Known Allergies  Medications Prior to Admission  Medication Sig Dispense Refill Last Dose   Prenatal Vit-Fe Fumarate-FA (PRENATAL MULTIVITAMIN) TABS tablet Take 1 tablet by mouth daily at 12 noon.   01/04/2022   coconut oil OIL Apply 1 application topically as needed.  0    ibuprofen (ADVIL) 600 MG tablet Take 1 tablet (600 mg total) by mouth every 6 (six) hours. 30 tablet 0    oxyCODONE (OXY IR/ROXICODONE) 5 MG immediate release tablet Take 1 tablet (5 mg total) by mouth every 6 (six) hours as needed for moderate pain. 20 tablet 0    senna-docusate (SENOKOT-S) 8.6-50 MG tablet Take 2 tablets by mouth at bedtime as needed for mild constipation. 30 tablet 0     Review of Systems  Gastrointestinal:  Positive for abdominal pain.  Genitourinary:  Positive for vaginal bleeding.  All other systems reviewed and are negative.  Physical Exam   Blood pressure (!) 110/57, pulse (!) 102, temperature 98.2 F (36.8 C), temperature source Oral, resp. rate 18, height 5'  4" (1.626 m), weight 124.8 kg, SpO2 99 %, unknown if currently breastfeeding.  Physical Exam Vitals and nursing note reviewed. Exam conducted with a chaperone present.  Constitutional:      Appearance: She is not ill-appearing.  Cardiovascular:     Rate and Rhythm: Normal rate and regular rhythm.     Heart sounds: Normal heart sounds.  Pulmonary:     Effort: Pulmonary effort is normal.     Breath sounds: Normal breath sounds.  Abdominal:     Palpations: Abdomen is soft.     Tenderness: There is no abdominal tenderness.     Comments: Gravid  Genitourinary:    Comments: Pelvic exam: Small laceration on medial aspect of right labia. Hemostatic. External genitalia otherwise normal. Vaginal walls pink and well rugated, cervix visually closed, no lesions noted. No blood or abnormal discharge observed.   Skin:    Capillary Refill: Capillary refill takes  less than 2 seconds.  Neurological:     Mental Status: She is alert and oriented to person, place, and time.  Psychiatric:        Mood and Affect: Mood normal.        Behavior: Behavior normal.     MAU Course  Procedures  MDM  --Reactive tracing: baseline 140, mod var, + accels, no decels --Toco: quiet  Orders Placed This Encounter  Procedures   Wet prep, genital   [place] MFM OB Limited   Urinalysis, Routine w reflex microscopic   CBC   Discharge patient   Patient Vitals for the past 24 hrs:  BP Temp Temp src Pulse Resp SpO2 Height Weight  01/04/22 2350 (!) 126/59 -- -- 87 17 99 % -- --  01/04/22 2153 (!) 110/57 98.2 F (36.8 C) Oral (!) 102 18 -- 5\' 4"  (1.626 m) 124.8 kg  01/04/22 2047 (!) 144/78 98.2 F (36.8 C) Oral 99 16 99 % 5\' 4"  (1.626 m) 112 kg   Results for orders placed or performed during the hospital encounter of 01/04/22 (from the past 24 hour(s))  Urinalysis, Routine w reflex microscopic     Status: Abnormal   Collection Time: 01/04/22 10:09 PM  Result Value Ref Range   Color, Urine YELLOW YELLOW   APPearance CLEAR CLEAR   Specific Gravity, Urine 1.015 1.005 - 1.030   pH 5.0 5.0 - 8.0   Glucose, UA NEGATIVE NEGATIVE mg/dL   Hgb urine dipstick NEGATIVE NEGATIVE   Bilirubin Urine NEGATIVE NEGATIVE   Ketones, ur 20 (A) NEGATIVE mg/dL   Protein, ur NEGATIVE NEGATIVE mg/dL   Nitrite NEGATIVE NEGATIVE   Leukocytes,Ua NEGATIVE NEGATIVE  Wet prep, genital     Status: None   Collection Time: 01/04/22 10:29 PM  Result Value Ref Range   Yeast Wet Prep HPF POC NONE SEEN NONE SEEN   Trich, Wet Prep NONE SEEN NONE SEEN   Clue Cells Wet Prep HPF POC NONE SEEN NONE SEEN   WBC, Wet Prep HPF POC <10 <10   Sperm NONE SEEN   CBC     Status: Abnormal   Collection Time: 01/04/22 10:50 PM  Result Value Ref Range   WBC 13.0 (H) 4.0 - 10.5 K/uL   RBC 3.97 3.87 - 5.11 MIL/uL   Hemoglobin 11.6 (L) 12.0 - 15.0 g/dL   HCT 01/06/22 (L) 01/06/22 - 08.6 %   MCV 83.6 80.0 - 100.0 fL    MCH 29.2 26.0 - 34.0 pg   MCHC 34.9 30.0 - 36.0 g/dL   RDW 14.0  11.5 - 15.5 %   Platelets 226 150 - 400 K/uL   nRBC 0.0 0.0 - 0.2 %   Assessment and Plan  --26 y.o. G2P1001 at [redacted]w[redacted]d  --Reactive tracing --Closed cervix, posterior placenta --No concerning findings on OB MFM ultrasound --Spotting likely associated with labial wound --Rh POS, Rhogam not indicated --Discharge home in stable condition  Darlina Rumpf, [place], MSN, CNM 01/05/2022, 12:01 AM

## 2022-01-04 NOTE — MAU Note (Signed)
.  Tammy Randall is a 26 y.o. at Unknown here in MAU reporting: transfer from [facility] ED with c/o ABD cramping since 1030 that started on left mid side, and currently feeling lower ABD that is intermittent with a "pulsing sensation" and pressure. Pt not sure if having [place] [address]. Pt also report VB spotting 0545 and 1200 today that was dark red with wiping, not currently wearing a pad. Pt states she has a feeling like she has to pee badly or discharge is coming out, but nothing happens. Pt denies DFM, LOF, abnormal discharge, recent intercourse, and complications in the pregnancy.   Onset of complaint: 1030 Pain score: 3/10 Vitals:   01/04/22 2047 01/04/22 2153  BP: (!) 144/78 (!) 110/57  Pulse: 99 (!) 102  Resp: 16 18  Temp: 98.2 F (36.8 C) 98.2 F (36.8 C)  SpO2: 99%      FHT:150 Lab orders placed from triage:  ua

## 2022-01-04 NOTE — ED Triage Notes (Signed)
Pt reports that she is [redacted] weeks pregnant and having camps all day and some vaginal bleeding this morning. G2P1

## 2022-01-04 NOTE — ED Notes (Signed)
Pt sent to MAU via POV

## 2022-01-04 NOTE — Discharge Instructions (Addendum)

## 2022-01-04 NOTE — ED Provider Notes (Signed)
[facility] DEPT Provider Note   CSN: [id number] Arrival date & time: 01/04/22  2015     History  Chief Complaint  Patient presents with   Abdominal Pain    Tammy Randall is a 26 y.o. female [redacted] weeks pregnant presenting today with abdominal cramping and some bright red blood this morning.  Reports she is still feeling the baby move but she is concerned and wanted to come in to be evaluated prior to her OB/GYN appointment next week.  Not passing clots.    Abdominal Pain      Home Medications Prior to Admission medications   Medication Sig Start Date End Date Taking? Authorizing Provider  coconut oil OIL Apply 1 application topically as needed. 12/14/20   Sigmon, Tyler Deis, CNM  ibuprofen (ADVIL) 600 MG tablet Take 1 tablet (600 mg total) by mouth every 6 (six) hours. 12/14/20   Darliss Cheney, CNM  oxyCODONE (OXY IR/ROXICODONE) 5 MG immediate release tablet Take 1 tablet (5 mg total) by mouth every 6 (six) hours as needed for moderate pain. 12/14/20   Darliss Cheney, CNM  Prenatal Vit-Fe Fumarate-FA (PRENATAL MULTIVITAMIN) TABS tablet Take 1 tablet by mouth daily at 12 noon.    [provider]  senna-docusate (SENOKOT-S) 8.6-50 MG tablet Take 2 tablets by mouth at bedtime as needed for mild constipation. 12/14/20   Sigmon, Tyler Deis, CNM  norethindrone (ORTHO MICRONOR) 0.35 MG tablet Take 1 tablet (0.35 mg total) by mouth daily. 07/29/19 12/10/19  Nicolette Bang, MD      Allergies    Patient has no known allergies.    Review of Systems   Review of Systems  Gastrointestinal:  Positive for abdominal pain.    Physical Exam Updated Vital Signs BP (!) 144/78 (BP Location: Left Arm)   Pulse 99   Temp 98.2 F (36.8 C) (Oral)   Resp 16   Ht 5\' 4"  (1.626 m)   Wt 112 kg   SpO2 99%   BMI 42.40 kg/m  Physical Exam Vitals and nursing note reviewed.  Constitutional:      Appearance: Normal appearance.  HENT:      Head: Normocephalic and atraumatic.  Eyes:     General: No scleral icterus.    Conjunctiva/sclera: Conjunctivae normal.  Pulmonary:     Effort: Pulmonary effort is normal. No respiratory distress.  Abdominal:     Comments: Gravid abdomen  Genitourinary:    Comments: Pelvic exam not performed in triage Skin:    Findings: No rash.  Neurological:     Mental Status: She is alert.  Psychiatric:        Mood and Affect: Mood normal.     ED Results / Procedures / Treatments   Labs (all labs ordered are listed, but only abnormal results are displayed) Labs Reviewed - No data to display  EKG None  Radiology No results found.  Procedures Procedures   Medications Ordered in ED Medications - No data to display  ED Course/ Medical Decision Making/ A&P                           Medical Decision Making  I called rapid OB nurse Alex who spoke with the MAU attending.  They accept the patient for transfer over to their unit.  Patient will go POV.   Final Clinical Impression(s) / ED Diagnoses Final diagnoses:  Vaginal bleeding in pregnancy    Rx / DC  Orders ED Discharge Orders     None     Discharged to MAU    Trayvond Viets, Gabriel Cirri, PA-C 01/04/22 2113    Loetta Rough, MD 01/04/22 [phone]

## 2022-01-05 DIAGNOSIS — O26852 Spotting complicating pregnancy, second trimester: Secondary | ICD-10-CM

## 2022-01-05 DIAGNOSIS — Z3A26 26 weeks gestation of pregnancy: Secondary | ICD-10-CM | POA: Diagnosis not present

## 2022-01-06 LAB — GC/CHLAMYDIA PROBE AMP (~~LOC~~) NOT AT ARMC
Chlamydia: NEGATIVE
Comment: NEGATIVE
Comment: NORMAL
Neisseria Gonorrhea: NEGATIVE

## 2022-03-18 ENCOUNTER — Encounter (HOSPITAL_COMMUNITY): Payer: Self-pay

## 2022-03-18 NOTE — Patient Instructions (Signed)
Tammy Randall  03/18/2022   Your procedure is scheduled on:  04/01/2022  Arrive at 0800 at Entrance C on [address] at [facility]  and [address]. You are invited to use the FREE valet parking or use the Visitor's parking deck.  Pick up the phone at the desk and dial [phone].  Call this number if you have problems the morning of surgery: [phone]  Remember:   Do not eat food:(After Midnight) Desps de medianoche.  Do not drink clear liquids: (After Midnight) Desps de medianoche.  Take these medicines the morning of surgery with A SIP OF WATER:  none   Do not wear jewelry, make-up or nail polish.  Do not wear lotions, powders, or perfumes. Do not wear deodorant.  Do not shave 48 hours prior to surgery.  Do not bring valuables to the hospital.  [facility] is not   responsible for any belongings or valuables brought to the hospital.  Contacts, dentures or bridgework may not be worn into surgery.  Leave suitcase in the car. After surgery it may be brought to your room.  For patients admitted to the hospital, checkout time is 11:00 AM the day of              discharge.      Please read over the following fact sheets that you were given:     Preparing for Surgery

## 2022-03-20 NOTE — Patient Instructions (Signed)
Tammy Randall  03/20/2022   Your procedure is scheduled on:  03/22/2022  Arrive at 0915 at Entrance C on [address] at [facility]  and [employer]. You are invited to use the FREE valet parking or use the Visitor's parking deck.  Pick up the phone at the desk and dial [phone].  Call this number if you have problems the morning of surgery: [phone]  Remember:   Do not eat food:(After Midnight) Desps de medianoche.  Do not drink clear liquids: (After Midnight) Desps de medianoche.  Take these medicines the morning of surgery with A SIP OF WATER:  none   Do not wear jewelry, make-up or nail polish.  Do not wear lotions, powders, or perfumes. Do not wear deodorant.  Do not shave 48 hours prior to surgery.  Do not bring valuables to the hospital.  [facility] is not   responsible for any belongings or valuables brought to the hospital.  Contacts, dentures or bridgework may not be worn into surgery.  Leave suitcase in the car. After surgery it may be brought to your room.  For patients admitted to the hospital, checkout time is 11:00 AM the day of              discharge.      Please read over the following fact sheets that you were given:     Preparing for Surgery

## 2022-03-21 ENCOUNTER — Encounter (HOSPITAL_COMMUNITY)
Admission: RE | Admit: 2022-03-21 | Discharge: 2022-03-21 | Disposition: A | Discharge status: Home / Self Care | Payer: Medicaid Other | Source: Ambulatory Visit | Attending: Obstetrics and Gynecology | Admitting: Obstetrics and Gynecology

## 2022-03-21 DIAGNOSIS — Z01812 Encounter for preprocedural laboratory examination: Secondary | ICD-10-CM | POA: Diagnosis not present

## 2022-03-21 DIAGNOSIS — Z98891 History of uterine scar from previous surgery: Secondary | ICD-10-CM | POA: Diagnosis not present

## 2022-03-21 LAB — RPR: RPR Ser Ql: NONREACTIVE

## 2022-03-21 LAB — CBC
HCT: 36.3 % (ref 36.0–46.0)
Hemoglobin: 12.5 g/dL (ref 12.0–15.0)
MCH: 29.2 pg (ref 26.0–34.0)
MCHC: 34.4 g/dL (ref 30.0–36.0)
MCV: 84.8 fL (ref 80.0–100.0)
Platelets: 223 10*3/uL (ref 150–400)
RBC: 4.28 MIL/uL (ref 3.87–5.11)
RDW: 13.8 % (ref 11.5–15.5)
WBC: 9.8 10*3/uL (ref 4.0–10.5)
nRBC: 0 % (ref 0.0–0.2)

## 2022-03-21 LAB — TYPE AND SCREEN
ABO/RH(D): O POS
Antibody Screen: NEGATIVE

## 2022-03-22 ENCOUNTER — Inpatient Hospital Stay (HOSPITAL_COMMUNITY)
Admission: RE | Admit: 2022-03-22 | Discharge: 2022-03-24 | DRG: 787 | Disposition: A | Discharge status: Home / Self Care | Payer: BC Managed Care – PPO | Attending: Obstetrics and Gynecology | Admitting: Obstetrics and Gynecology

## 2022-03-22 ENCOUNTER — Encounter (HOSPITAL_COMMUNITY): Payer: Self-pay | Admitting: Obstetrics and Gynecology

## 2022-03-22 ENCOUNTER — Other Ambulatory Visit: Payer: Self-pay

## 2022-03-22 ENCOUNTER — Inpatient Hospital Stay (HOSPITAL_COMMUNITY): Payer: BC Managed Care – PPO | Admitting: Anesthesiology

## 2022-03-22 ENCOUNTER — Encounter (HOSPITAL_COMMUNITY)
Admission: RE | Disposition: A | Discharge status: Home / Self Care | Payer: Self-pay | Source: Home / Self Care | Attending: Obstetrics and Gynecology

## 2022-03-22 DIAGNOSIS — O99354 Diseases of the nervous system complicating childbirth: Secondary | ICD-10-CM | POA: Diagnosis present

## 2022-03-22 DIAGNOSIS — G809 Cerebral palsy, unspecified: Secondary | ICD-10-CM | POA: Diagnosis present

## 2022-03-22 DIAGNOSIS — O4103X0 Oligohydramnios, third trimester, not applicable or unspecified: Secondary | ICD-10-CM

## 2022-03-22 DIAGNOSIS — J45909 Unspecified asthma, uncomplicated: Secondary | ICD-10-CM

## 2022-03-22 DIAGNOSIS — O34211 Maternal care for low transverse scar from previous cesarean delivery: Secondary | ICD-10-CM

## 2022-03-22 DIAGNOSIS — Z98891 History of uterine scar from previous surgery: Principal | ICD-10-CM

## 2022-03-22 DIAGNOSIS — F1290 Cannabis use, unspecified, uncomplicated: Secondary | ICD-10-CM | POA: Diagnosis present

## 2022-03-22 DIAGNOSIS — O365930 Maternal care for other known or suspected poor fetal growth, third trimester, not applicable or unspecified: Secondary | ICD-10-CM | POA: Diagnosis present

## 2022-03-22 DIAGNOSIS — Z3A37 37 weeks gestation of pregnancy: Secondary | ICD-10-CM

## 2022-03-22 DIAGNOSIS — O9952 Diseases of the respiratory system complicating childbirth: Secondary | ICD-10-CM | POA: Diagnosis not present

## 2022-03-22 DIAGNOSIS — O99214 Obesity complicating childbirth: Secondary | ICD-10-CM | POA: Diagnosis present

## 2022-03-22 DIAGNOSIS — O99324 Drug use complicating childbirth: Secondary | ICD-10-CM | POA: Diagnosis present

## 2022-03-22 SURGERY — Surgical Case
Anesthesia: Spinal

## 2022-03-22 MED ORDER — ACETAMINOPHEN 10 MG/ML IV SOLN
INTRAVENOUS | Status: AC
  Filled 2022-03-22: qty 100

## 2022-03-22 MED ORDER — ORAL CARE MOUTH RINSE: 15.0000 mL | Freq: Once | OROMUCOSAL | Status: DC

## 2022-03-22 MED ORDER — ACETAMINOPHEN 10 MG/ML IV SOLN
INTRAVENOUS | Status: DC | PRN
  Administered 2022-03-22: 1000 mg via INTRAVENOUS

## 2022-03-22 MED ORDER — ONDANSETRON HCL 4 MG/2ML IJ SOLN
INTRAMUSCULAR | Status: AC
  Filled 2022-03-22: qty 2

## 2022-03-22 MED ORDER — OXYTOCIN-SODIUM CHLORIDE 30-0.9 UT/500ML-% IV SOLN
INTRAVENOUS | Status: DC | PRN
  Administered 2022-03-22: 30 [IU] via INTRAVENOUS

## 2022-03-22 MED ORDER — FENTANYL CITRATE (PF) 100 MCG/2ML IJ SOLN
INTRAMUSCULAR | Status: DC | PRN
  Administered 2022-03-22: 15 ug via INTRAVENOUS

## 2022-03-22 MED ORDER — STERILE WATER FOR IRRIGATION IR SOLN
Status: DC | PRN
  Administered 2022-03-22: 1

## 2022-03-22 MED ORDER — DIPHENHYDRAMINE HCL 50 MG/ML IJ SOLN: 12.5000 mg | INTRAMUSCULAR | Status: DC | PRN

## 2022-03-22 MED ORDER — SOD CITRATE-CITRIC ACID 500-334 MG/5ML PO SOLN
ORAL | Status: AC
  Filled 2022-03-22: qty 30

## 2022-03-22 MED ORDER — SIMETHICONE 80 MG PO CHEW
80.0000 mg | CHEWABLE_TABLET | Freq: Three times a day (TID) | ORAL | Status: DC
  Administered 2022-03-22 – 2022-03-24 (×5): 80 mg via ORAL
  Filled 2022-03-22 (×5): qty 1

## 2022-03-22 MED ORDER — FAMOTIDINE 20 MG PO TABS
ORAL_TABLET | ORAL | Status: AC
  Filled 2022-03-22: qty 1

## 2022-03-22 MED ORDER — OXYTOCIN-SODIUM CHLORIDE 30-0.9 UT/500ML-% IV SOLN
2.5000 [IU]/h | INTRAVENOUS | Status: AC
  Administered 2022-03-22: 2.5 [IU]/h via INTRAVENOUS
  Filled 2022-03-22: qty 500

## 2022-03-22 MED ORDER — OXYCODONE HCL 5 MG PO TABS: 5.0000 mg | ORAL_TABLET | ORAL | Status: DC | PRN

## 2022-03-22 MED ORDER — OXYTOCIN-SODIUM CHLORIDE 30-0.9 UT/500ML-% IV SOLN
INTRAVENOUS | Status: AC
  Filled 2022-03-22: qty 500

## 2022-03-22 MED ORDER — KETOROLAC TROMETHAMINE 30 MG/ML IJ SOLN
30.0000 mg | Freq: Four times a day (QID) | INTRAMUSCULAR | Status: AC | PRN
  Filled 2022-03-22: qty 1

## 2022-03-22 MED ORDER — PHENYLEPHRINE HCL-NACL 20-0.9 MG/250ML-% IV SOLN
INTRAVENOUS | Status: DC | PRN
  Administered 2022-03-22: 60 ug/min via INTRAVENOUS

## 2022-03-22 MED ORDER — LIDOCAINE HCL (PF) 1 % IJ SOLN
INTRAMUSCULAR | Status: AC
  Filled 2022-03-22: qty 5

## 2022-03-22 MED ORDER — COCONUT OIL OIL: 1.0000 | TOPICAL_OIL | Status: DC | PRN

## 2022-03-22 MED ORDER — SODIUM CHLORIDE 0.9 % IR SOLN
Status: DC | PRN
  Administered 2022-03-22: 1

## 2022-03-22 MED ORDER — SENNOSIDES-DOCUSATE SODIUM 8.6-50 MG PO TABS
2.0000 | ORAL_TABLET | Freq: Every day | ORAL | Status: DC
  Administered 2022-03-23 – 2022-03-24 (×2): 2 via ORAL
  Filled 2022-03-22 (×2): qty 2

## 2022-03-22 MED ORDER — SOD CITRATE-CITRIC ACID 500-334 MG/5ML PO SOLN
30.0000 mL | Freq: Once | ORAL | Status: AC
  Administered 2022-03-22: 30 mL via ORAL

## 2022-03-22 MED ORDER — KETOROLAC TROMETHAMINE 30 MG/ML IJ SOLN
30.0000 mg | Freq: Four times a day (QID) | INTRAMUSCULAR | Status: AC
  Administered 2022-03-22 – 2022-03-23 (×4): 30 mg via INTRAVENOUS
  Filled 2022-03-22 (×3): qty 1

## 2022-03-22 MED ORDER — BUPIVACAINE IN DEXTROSE 0.75-8.25 % IT SOLN
INTRATHECAL | Status: DC | PRN
  Administered 2022-03-22: 1.6 mL via INTRATHECAL

## 2022-03-22 MED ORDER — FENTANYL CITRATE (PF) 100 MCG/2ML IJ SOLN: 25.0000 ug | INTRAMUSCULAR | Status: DC | PRN

## 2022-03-22 MED ORDER — FAMOTIDINE 20 MG PO TABS
20.0000 mg | ORAL_TABLET | Freq: Once | ORAL | Status: AC
  Administered 2022-03-22: 20 mg via ORAL

## 2022-03-22 MED ORDER — CHLORHEXIDINE GLUCONATE 0.12 % MT SOLN: 15.0000 mL | Freq: Once | OROMUCOSAL | Status: DC

## 2022-03-22 MED ORDER — SCOPOLAMINE 1 MG/3DAYS TD PT72: 1.0000 | MEDICATED_PATCH | Freq: Once | TRANSDERMAL | Status: DC

## 2022-03-22 MED ORDER — MEPERIDINE HCL 25 MG/ML IJ SOLN: 6.2500 mg | INTRAMUSCULAR | Status: DC | PRN

## 2022-03-22 MED ORDER — DEXAMETHASONE SODIUM PHOSPHATE 4 MG/ML IJ SOLN
INTRAMUSCULAR | Status: AC
  Filled 2022-03-22: qty 1

## 2022-03-22 MED ORDER — KETOROLAC TROMETHAMINE 30 MG/ML IJ SOLN: 30.0000 mg | Freq: Four times a day (QID) | INTRAMUSCULAR | Status: AC | PRN

## 2022-03-22 MED ORDER — MORPHINE SULFATE (PF) 0.5 MG/ML IJ SOLN
INTRAMUSCULAR | Status: DC | PRN
  Administered 2022-03-22: 150 ug via EPIDURAL

## 2022-03-22 MED ORDER — MENTHOL 3 MG MT LOZG: 1.0000 | LOZENGE | OROMUCOSAL | Status: DC | PRN

## 2022-03-22 MED ORDER — DIBUCAINE (PERIANAL) 1 % EX OINT: 1.0000 | TOPICAL_OINTMENT | CUTANEOUS | Status: DC | PRN

## 2022-03-22 MED ORDER — ACETAMINOPHEN 500 MG PO TABS: 1000.0000 mg | ORAL_TABLET | Freq: Four times a day (QID) | ORAL | Status: DC

## 2022-03-22 MED ORDER — LACTATED RINGERS IV SOLN: INTRAVENOUS | Status: DC

## 2022-03-22 MED ORDER — POVIDONE-IODINE 10 % EX SWAB
2.0000 | Freq: Once | CUTANEOUS | Status: AC
  Administered 2022-03-22: 2 via TOPICAL

## 2022-03-22 MED ORDER — NALOXONE HCL 0.4 MG/ML IJ SOLN: 0.4000 mg | INTRAMUSCULAR | Status: DC | PRN

## 2022-03-22 MED ORDER — ONDANSETRON HCL 4 MG/2ML IJ SOLN: 4.0000 mg | Freq: Three times a day (TID) | INTRAMUSCULAR | Status: DC | PRN

## 2022-03-22 MED ORDER — ENOXAPARIN SODIUM 60 MG/0.6ML IJ SOSY
60.0000 mg | PREFILLED_SYRINGE | INTRAMUSCULAR | Status: DC
  Administered 2022-03-23 – 2022-03-24 (×2): 60 mg via SUBCUTANEOUS
  Filled 2022-03-22 (×2): qty 0.6

## 2022-03-22 MED ORDER — DIPHENHYDRAMINE HCL 25 MG PO CAPS
25.0000 mg | ORAL_CAPSULE | ORAL | Status: DC | PRN
  Administered 2022-03-22 (×2): 25 mg via ORAL
  Filled 2022-03-22: qty 1

## 2022-03-22 MED ORDER — IBUPROFEN 600 MG PO TABS
600.0000 mg | ORAL_TABLET | Freq: Four times a day (QID) | ORAL | Status: DC
  Administered 2022-03-23 – 2022-03-24 (×3): 600 mg via ORAL
  Filled 2022-03-22 (×4): qty 1

## 2022-03-22 MED ORDER — DIPHENHYDRAMINE HCL 25 MG PO CAPS
25.0000 mg | ORAL_CAPSULE | Freq: Four times a day (QID) | ORAL | Status: DC | PRN
  Filled 2022-03-22: qty 1

## 2022-03-22 MED ORDER — ZOLPIDEM TARTRATE 5 MG PO TABS: 5.0000 mg | ORAL_TABLET | Freq: Every evening | ORAL | Status: DC | PRN

## 2022-03-22 MED ORDER — NALOXONE HCL 4 MG/10ML IJ SOLN: 1.0000 ug/kg/h | INTRAVENOUS | Status: DC | PRN

## 2022-03-22 MED ORDER — CEFAZOLIN IN SODIUM CHLORIDE 3-0.9 GM/100ML-% IV SOLN
INTRAVENOUS | Status: AC
  Filled 2022-03-22: qty 100

## 2022-03-22 MED ORDER — ONDANSETRON HCL 4 MG/2ML IJ SOLN
INTRAMUSCULAR | Status: DC | PRN
  Administered 2022-03-22: 4 mg via INTRAVENOUS

## 2022-03-22 MED ORDER — SIMETHICONE 80 MG PO CHEW: 80.0000 mg | CHEWABLE_TABLET | ORAL | Status: DC | PRN

## 2022-03-22 MED ORDER — SODIUM CHLORIDE 0.9 % IV SOLN: INTRAVENOUS | Status: DC | PRN

## 2022-03-22 MED ORDER — ACETAMINOPHEN 500 MG PO TABS
1000.0000 mg | ORAL_TABLET | Freq: Four times a day (QID) | ORAL | Status: DC
  Administered 2022-03-22 – 2022-03-24 (×5): 1000 mg via ORAL
  Filled 2022-03-22 (×6): qty 2

## 2022-03-22 MED ORDER — DEXAMETHASONE SODIUM PHOSPHATE 4 MG/ML IJ SOLN
INTRAMUSCULAR | Status: DC | PRN
  Administered 2022-03-22: 4 mg via INTRAVENOUS

## 2022-03-22 MED ORDER — CEFAZOLIN IN SODIUM CHLORIDE 3-0.9 GM/100ML-% IV SOLN
3.0000 g | INTRAVENOUS | Status: AC
  Administered 2022-03-22: 3 g via INTRAVENOUS

## 2022-03-22 MED ORDER — WITCH HAZEL-GLYCERIN EX PADS: 1.0000 | MEDICATED_PAD | CUTANEOUS | Status: DC | PRN

## 2022-03-22 MED ORDER — PRENATAL MULTIVITAMIN CH
1.0000 | ORAL_TABLET | Freq: Every day | ORAL | Status: DC
  Administered 2022-03-23: 1 via ORAL
  Filled 2022-03-22: qty 1

## 2022-03-22 MED ORDER — FENTANYL CITRATE (PF) 100 MCG/2ML IJ SOLN
INTRAMUSCULAR | Status: AC
  Filled 2022-03-22: qty 2

## 2022-03-22 MED ORDER — SODIUM CHLORIDE 0.9% FLUSH: 3.0000 mL | INTRAVENOUS | Status: DC | PRN

## 2022-03-22 MED ORDER — SCOPOLAMINE 1 MG/3DAYS TD PT72
1.0000 | MEDICATED_PATCH | Freq: Once | TRANSDERMAL | Status: DC
  Administered 2022-03-22: 1.5 mg via TRANSDERMAL

## 2022-03-22 MED ORDER — MORPHINE SULFATE (PF) 0.5 MG/ML IJ SOLN
INTRAMUSCULAR | Status: AC
  Filled 2022-03-22: qty 10

## 2022-03-22 MED ORDER — PROMETHAZINE HCL 25 MG/ML IJ SOLN: 6.2500 mg | INTRAMUSCULAR | Status: DC | PRN

## 2022-03-22 SURGICAL SUPPLY — 39 items
BENZOIN TINCTURE PRP APPL 2/3 (GAUZE/BANDAGES/DRESSINGS) IMPLANT
CHLORAPREP W/TINT 26 (MISCELLANEOUS) ×2 IMPLANT
CLAMP UMBILICAL CORD (MISCELLANEOUS) ×1 IMPLANT
CLOTH BEACON ORANGE TIMEOUT ST (SAFETY) ×1 IMPLANT
DERMABOND ADVANCED .7 DNX12 (GAUZE/BANDAGES/DRESSINGS) ×1 IMPLANT
DRSG OPSITE POSTOP 4X10 (GAUZE/BANDAGES/DRESSINGS) ×1 IMPLANT
ELECT REM PT RETURN 9FT ADLT (ELECTROSURGICAL) ×1
ELECTRODE REM PT RTRN 9FT ADLT (ELECTROSURGICAL) ×1 IMPLANT
EXTRACTOR VACUUM BELL CUP MITY (SUCTIONS) IMPLANT
GAUZE SPONGE 4X4 12PLY STRL LF (GAUZE/BANDAGES/DRESSINGS) IMPLANT
GLOVE BIO SURGEON STRL SZ 6.5 (GLOVE) ×1 IMPLANT
GLOVE BIOGEL PI IND STRL 6.5 (GLOVE) ×1 IMPLANT
GLOVE BIOGEL PI IND STRL 7.0 (GLOVE) ×2 IMPLANT
GOWN STRL REUS W/TWL LRG LVL3 (GOWN DISPOSABLE) ×2 IMPLANT
KIT ABG SYR 3ML LUER SLIP (SYRINGE) IMPLANT
NDL HYPO 25X5/8 SAFETYGLIDE (NEEDLE) IMPLANT
NEEDLE HYPO 25X5/8 SAFETYGLIDE (NEEDLE) IMPLANT
NS IRRIG 1000ML POUR BTL (IV SOLUTION) ×1 IMPLANT
PACK C SECTION WH (CUSTOM PROCEDURE TRAY) ×1 IMPLANT
PAD ABD 7.5X8 STRL (GAUZE/BANDAGES/DRESSINGS) IMPLANT
PAD ABD 8X10 STRL (GAUZE/BANDAGES/DRESSINGS) IMPLANT
PAD OB MATERNITY 4.3X12.25 (PERSONAL CARE ITEMS) ×1 IMPLANT
RTRCTR C-SECT PINK 25CM LRG (MISCELLANEOUS) IMPLANT
STRIP CLOSURE SKIN 1/2X4 (GAUZE/BANDAGES/DRESSINGS) IMPLANT
SUT PDS AB 0 CTX 36 PDP370T (SUTURE) IMPLANT
SUT PLAIN 2 0 (SUTURE)
SUT PLAIN 2 0 XLH (SUTURE) IMPLANT
SUT PLAIN ABS 2-0 CT1 27XMFL (SUTURE) IMPLANT
SUT VIC AB 0 CT1 36 (SUTURE) ×2 IMPLANT
SUT VIC AB 2-0 CT1 27 (SUTURE) ×1
SUT VIC AB 2-0 CT1 TAPERPNT 27 (SUTURE) ×1 IMPLANT
SUT VIC AB 3-0 SH 27 (SUTURE)
SUT VIC AB 3-0 SH 27X BRD (SUTURE) IMPLANT
SUT VIC AB 4-0 KS 27 (SUTURE) ×1 IMPLANT
TOWEL OR 17X24 6PK STRL BLUE (TOWEL DISPOSABLE) ×1 IMPLANT
TRAY FOLEY W/BAG SLVR 14FR LF (SET/KITS/TRAYS/PACK) ×1 IMPLANT
TUBING NON-CON 1/4 X 20 CONN (TUBING) IMPLANT
WATER STERILE IRR 1000ML POUR (IV SOLUTION) ×1 IMPLANT
YANKAUER SUCT BULB TIP NO VENT (SUCTIONS) IMPLANT

## 2022-03-22 NOTE — Transfer of Care (Signed)
Immediate Anesthesia Transfer of Care Note  Patient: Tammy Randall  Procedure(s) Performed: CESAREAN SECTION  Patient Location: PACU  Anesthesia Type:Spinal  Level of Consciousness: awake, alert , and oriented  Airway & Oxygen Therapy: Patient Spontanous Breathing  Post-op Assessment: Report given to RN and Post -op Vital signs reviewed and stable  Post vital signs: Reviewed and stable  Last Vitals:  Vitals Value Taken Time  BP 110/66 03/22/22 1530  Temp 36.6 C 03/22/22 1515  Pulse 69 03/22/22 1539  Resp 17 03/22/22 1539  SpO2 98 % 03/22/22 1539  Vitals shown include unvalidated device data.  Last Pain:  Vitals:   03/22/22 1530  TempSrc:   PainSc: 0-No pain         Complications: No notable events documented.

## 2022-03-22 NOTE — H&P (Signed)
Tammy Randall is a 26 y.o. female presenting for scheduled ERLTCS. +FM< denies VB, LOF, CTX.  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 OB History     Gravida  2   Para  1   Term  1   Preterm      AB      Living  1      SAB      IAB      Ectopic      Multiple  0   Live Births  1          Past Medical History:  Diagnosis Date   Asthma    CP (cerebral palsy) (HCC)    Migraine    Past Surgical History:  Procedure Laterality Date   CESAREAN SECTION N/A 12/11/2020   Procedure: CESAREAN SECTION;  Surgeon: Vick Frees, MD;  Location: [facility] LD ORS;  Service: Obstetrics;  Laterality: N/A;   Family History: family history includes CVA in her maternal grandfather; Diabetes in her maternal grandmother and mother; High Cholesterol in her maternal grandmother; Hypertension in her maternal grandmother and mother. Social History:  reports that she has never smoked. She has never used smokeless tobacco. She reports that she does not currently use drugs after having used the following drugs: Marijuana. She reports that she does not drink alcohol.     Maternal Diabetes: No 1hr 129 Genetic Screening: Normal low risk NIPT Maternal Ultrasounds/Referrals: IUGR Fetal Ultrasounds or other Referrals:  None Maternal Substance Abuse:  Yes:  Type: Marijuana Significant Maternal Medications:  None Significant Maternal Lab Results:  Group B Strep negative Number of Prenatal Visits:greater than 3 verified prenatal visits Other Comments:  None  Review of Systems  Constitutional:  Negative for chills and fever.  Respiratory:  Negative  for shortness of breath.   Cardiovascular:  Negative for chest pain, palpitations and leg swelling.  Gastrointestinal:  Negative for abdominal pain and vomiting.  Neurological:  Negative for dizziness, weakness and headaches.  Psychiatric/Behavioral:  Negative for suicidal ideas.    Maternal Medical History:  Fetal activity: Perceived fetal activity is normal.   Last perceived fetal movement was within the past hour.   Prenatal Complications - Diabetes: none.     Blood pressure 115/66, pulse 80, temperature 98.2 F (36.8 C), temperature source Oral, resp. rate 17, height 5\' 3"  (1.6 m), weight 126.1 kg, SpO2 99 %, currently breastfeeding. Exam Physical Exam Constitutional:      General: She is not in acute distress.    Appearance: She is well-developed. She is obese.  HENT:     Head: Normocephalic and atraumatic.  Eyes:     Pupils: Pupils are equal, round, and reactive to light.  Cardiovascular:     Rate and Rhythm: Normal rate and regular rhythm.     Heart sounds: No murmur heard.    No gallop.  Abdominal:     Tenderness: There is no abdominal tenderness. There is no guarding or rebound.  Genitourinary:    Vagina: Normal.  Musculoskeletal:        General: Normal range of motion.  Cervical back: Normal range of motion and neck supple.  Skin:    General: Skin is warm and dry.  Neurological:     Mental Status: She is alert and oriented to person, place, and time.     Prenatal labs: ABO, Rh: --/--/O POS (12/29 0910) Antibody: NEG (12/29 0910) Rubella: Immune (08/02 0000) RPR: NON REACTIVE (12/29 0907)  HBsAg: Negative (08/02 0000)  HIV: Non-reactive (08/02 0000)  GBS:   neg  Assessment/Plan: This is a 26yo G2P1001 at 45 6/7 by 16wk scan presents for scheduled ERLTCS. Newly diagnosed oligohydramnios in setting of borderline IUGR. Declines TOLAC. BMI 49, TRAXI planned plus 3g Ancef R/B/A of cesarean section discussed with patient. Alternative would be vaginal delivery  which would mean shorter postpartum stay and decreased risk of bleeding. Risks of section include infection of the uterus, pelvic organs, or skin, inadvertent injury to internal organs, such as bowel or bladder. If there is major injury, extensive surgery may be required. If injury is minor, it may be treated with relative ease. Discussed possibility of excessive blood loss and transfusion. If bleeding cannot be controlled using medical or minor surgical methods, a cesarean hysterectomy may be performed which would mean no future fertility. Patient accepts the possibility of blood transfusion, if necessary. Patient understands and agrees to move forward with section.    Valerie Roys Emma Schupp 03/22/2022, 1:20 PM

## 2022-03-22 NOTE — Anesthesia Preprocedure Evaluation (Addendum)
Anesthesia Evaluation  Patient identified by MRN, date of birth, ID band Patient awake    Reviewed: Allergy & Precautions, NPO status , Patient's Chart, lab work & pertinent test results  Airway Mallampati: III  TM Distance: >3 FB Neck ROM: Full    Dental  (+) Teeth Intact, Dental Advisory Given   Pulmonary asthma    Pulmonary exam normal breath sounds clear to auscultation       Cardiovascular negative cardio ROS Normal cardiovascular exam Rhythm:Regular Rate:Normal     Neuro/Psych  Headaches CP    GI/Hepatic negative GI ROS, Neg liver ROS,,,  Endo/Other    Morbid obesity  Renal/GU negative Renal ROS     Musculoskeletal negative musculoskeletal ROS (+)    Abdominal   Peds  Hematology negative hematology ROS (+)   Anesthesia Other Findings   Reproductive/Obstetrics (+) Pregnancy H/o C-section x1                             Anesthesia Physical Anesthesia Plan  ASA: 3  Anesthesia Plan: Spinal   Post-op Pain Management: Minimal or no pain anticipated   Induction:   PONV Risk Score and Plan: 2 and Dexamethasone, Ondansetron and Scopolamine patch - Pre-op  Airway Management Planned: Natural Airway  Additional Equipment:   Intra-op Plan:   Post-operative Plan:   Informed Consent: I have reviewed the patients History and Physical, chart, labs and discussed the procedure including the risks, benefits and alternatives for the proposed anesthesia with the patient or authorized representative who has indicated his/her understanding and acceptance.     Dental advisory given  Plan Discussed with: CRNA  Anesthesia Plan Comments:        Anesthesia Quick Evaluation

## 2022-03-22 NOTE — Anesthesia Procedure Notes (Signed)
Spinal  Patient location during procedure: OR Start time: 03/22/2022 1:46 PM End time: 03/22/2022 1:56 PM Reason for block: surgical anesthesia Staffing Performed: anesthesiologist  Anesthesiologist: Collene Schlichter, MD Performed by: Collene Schlichter, MD Authorized by: Collene Schlichter, MD   Preanesthetic Checklist Completed: patient identified, IV checked, risks and benefits discussed, surgical consent, monitors and equipment checked, pre-op evaluation and timeout performed Spinal Block Patient position: sitting Prep: DuraPrep and site prepped and draped Patient monitoring: continuous pulse ox and blood pressure Approach: midline Location: L3-4 Injection technique: single-shot Needle Needle type: Pencan  Needle gauge: 24 G Assessment Sensory level: T4 Events: CSF return Additional Notes Functioning IV was confirmed and monitors were applied. Sterile prep and drape, including hand hygiene, mask and sterile gloves were used. The patient was positioned and the spine was prepped. The skin was anesthetized with lidocaine.  Free flow of clear CSF was obtained prior to injecting local anesthetic into the CSF.  The spinal needle aspirated freely following injection.  The needle was carefully withdrawn.  The patient tolerated the procedure well. Consent was obtained prior to procedure with all questions answered and concerns addressed. Risks including but not limited to bleeding, infection, nerve damage, paralysis, failed block, inadequate analgesia, allergic reaction, high spinal, itching and headache were discussed and the patient wished to proceed.   Arrie Aran, MD

## 2022-03-22 NOTE — Lactation Note (Signed)
This note was copied from a baby's chart. Lactation Consultation Note  Patient Name: Tammy Randall RSWNI'O Date: 03/22/2022 Reason for consult: Initial assessment;Early term 37-38.6wks Age:25 hours  Visited with family of 4 hours old ETI female, Tammy Randall is a P2 and has some experience breastfeeding. Assisted with hand expression and she was able to easily get colostrum, praised her for her efforts. Offered assistance with latch but she politely declined, she voiced baby just fed for 10 minutes at 1750. Asked her to call for assistance when needed. She'll consider pumping if baby is not latching consistently after the 24 hours mark, her plan is to do both, breast and formula feeding. So far baby has been latching when taken to the breast. Reviewed normal ETI behavior, feeding cues, size of baby's stomach, cluster feeding, lactogenesis II and anticipatory guidelines.   Maternal Data Has patient been taught Hand Expression?: Yes Does the patient have breastfeeding experience prior to this delivery?: Yes How long did the patient breastfeed?: 4 months  Feeding Mother's Current Feeding Choice: Breast Milk  LATCH Score Latch: Repeated attempts needed to sustain latch, nipple held in mouth throughout feeding, stimulation needed to elicit sucking reflex.  Audible Swallowing: None  Type of Nipple: Everted at rest and after stimulation  Comfort (Breast/Nipple): Soft / non-tender  Hold (Positioning): Assistance needed to correctly position infant at breast and maintain latch.  LATCH Score: 6 (Per RN)  Interventions Interventions: Breast feeding basics reviewed;Breast massage;Hand express;Education;LC Services brochure  Plan of care Encouraged to put baby to breast 8-12 times/24 hours or sooner if feeding cues are present Hand expression and breast massage were also encouraged prior latching  FOB and female visitor present and supportive. All questions and concerns answered,  family to contact [facility] services PRN.  Consult Status Consult Status: Follow-up Date: 03/23/22 Follow-up type: In-patient   Tammy Randall Tammy Randall 03/22/2022, 6:31 PM

## 2022-03-22 NOTE — Anesthesia Postprocedure Evaluation (Signed)
Anesthesia Post Note  Patient: Trishia Cuthrell  Procedure(s) Performed: CESAREAN SECTION     Patient location during evaluation: PACU Anesthesia Type: Spinal Level of consciousness: awake, awake and alert and oriented Pain management: pain level controlled Vital Signs Assessment: post-procedure vital signs reviewed and stable Respiratory status: spontaneous breathing, nonlabored ventilation and respiratory function stable Cardiovascular status: blood pressure returned to baseline and stable Postop Assessment: no headache, no backache, spinal receding and no apparent nausea or vomiting Anesthetic complications: no   No notable events documented.  Last Vitals:  Vitals:   03/22/22 1615 03/22/22 1648  BP: 134/72 130/68  Pulse: 69 74  Resp: (!) 28 17  Temp:  36.9 C  SpO2: 100% 100%    Last Pain:  Vitals:   03/22/22 1648  TempSrc:   PainSc: 0-No pain                 Collene Schlichter

## 2022-03-22 NOTE — Op Note (Signed)
C-Section Operative Note  Date: 03/22/22  Preoperative Diagnosis: IUP @ 37 6/7, h/o csx x1, oligohydramnios, borderline IUGR Postoperative Diagnosis: same as above Procedure: ERLTCS with vacuum-assistance Indication: Declining TOLAC, oligo and borderline IUGR as above Surgeon: Ellison Hughs, MD Assistant: Dr Patrcia Dolly An experienced assistant was required given the standard of surgical care given the complexity of the case and maternal body habitus.  This assistant was needed for exposure, dissection, suctioning, retraction, instrument exchange, assisting with delivery with administration of fundal pressure, and for overall help during the procedure.    Findings: Viable female infant weighing 6lb with APGARS of 9 and 9 at 1 and 5 minutes, respectively. Normal appearing uterus, bilateral fallopian tubes and ovaries. Specimens: Placenta to L&D EBL 211cc IVF 2400cc UOP 75cc  Consent:  R/B/A of cesarean section discussed with patient. Alternative would be vaginal delivery which would mean shorter postpartum stay and decreased risk of bleeding. Risks of section include infection of the uterus, pelvic organs, or skin, inadvertent injury to internal organs, such as bowel or bladder. If there is major injury, extensive surgery may be required. If injury is minor, it may be treated with relative ease. Discussed possibility of excessive blood loss and transfusion. If bleeding cannot be controlled using medical or minor surgical methods, a cesarean hysterectomy may be performed which would mean no future fertility. Patient accepts the possibility of blood transfusion, if necessary. Patient understands and agrees to move forward with section.   Operative Procedure: Patient was taken to the operating room where epidural anesthesia was found to be adequate by Allis clamp test. She was prepped and draped in the normal sterile fashion in the dorsal supine position with a leftward tilt. An  appropriate time out was performed. A Pfannenstiel skin incision was then made with the scalpel and carried through to the underlying layer of fascia by sharp dissection and Bovie cautery. The fascia was nicked in the midline and the incision was extended laterally with Mayo scissors. The superior aspect of the incision was grasped Coker clamps and dissected off the underlying rectus muscles. In a similar fashion the inferior aspect was dissected off the rectus muscles. Rectus muscles were separated in the midline and the peritoneal cavity entered bluntly. The peritoneal incision was then extended both superiorly and inferiorly with careful attention to avoid both bowel and bladder. The Alexis self-retaining wound retractor was then placed within the incision and the lower uterine segment exposed. The lower uterine segment was then incised in a low transverse fashion above the vesicouterine fold and the cavity itself entered bluntly. The incision was extended bluntly. Amniotic sac was ruptured and fluid was noted to be clear in color. The infant's head was then lifted and delivered from the incision with assistance of Mity Vac give large fetal occiput. The remainder of the infant delivered and the nose and mouth bulb suctioned with the cord clamped and cut as well. The infant was handed off to the waiting pediatricians. The placenta was then spontaneously expressed from the uterus and the uterus cleared of all clots and debris with moist lap sponge. The uterine incision was then repaired in 2 layers the first layer was a running locked layer of 0-vicryl and the second an imbricating layer of the same suture. The tubes and ovaries were inspected and the gutters cleared of all clots and debris. The uterine incision was inspected and found to be hemostatic. All instruments and sponges as well as the Alexis retractor were then removed from the  abdomen. The fascia was then closed with 0 Vicryl in a running fashion.  Subcutaneous tissue was reapproximated with 3-0 plain in a running fashion. The skin was closed with a subcuticular stitch of 4-0 Vicryl on a Keith needle and then reinforced with dermabond and a Honeycomb dressing. At the conclusion of the procedure all instruments and sponge counts were correct. Patient was taken to the recovery room in good condition with her baby accompanying her skin to skin.

## 2022-03-23 ENCOUNTER — Encounter (HOSPITAL_COMMUNITY): Payer: Self-pay | Admitting: Obstetrics and Gynecology

## 2022-03-23 LAB — CBC
HCT: 32.1 % — ABNORMAL LOW (ref 36.0–46.0)
Hemoglobin: 10.9 g/dL — ABNORMAL LOW (ref 12.0–15.0)
MCH: 28.9 pg (ref 26.0–34.0)
MCHC: 34 g/dL (ref 30.0–36.0)
MCV: 85.1 fL (ref 80.0–100.0)
Platelets: 185 10*3/uL (ref 150–400)
RBC: 3.77 MIL/uL — ABNORMAL LOW (ref 3.87–5.11)
RDW: 13.7 % (ref 11.5–15.5)
WBC: 12.8 10*3/uL — ABNORMAL HIGH (ref 4.0–10.5)
nRBC: 0 % (ref 0.0–0.2)

## 2022-03-23 LAB — CREATININE, SERUM
Creatinine, Ser: 0.52 mg/dL (ref 0.44–1.00)
GFR, Estimated: >60 mL/min (ref >60)

## 2022-03-23 NOTE — Lactation Note (Signed)
This note was copied from a baby's chart. Lactation Consultation Note  Patient Name: Girl Roshanda Balazs FVCBS'W Date: 03/23/2022 Reason for consult: Follow-up assessment;Early term 37-38.6wks;Breastfeeding assistance Age:25 hours   P2: Early term infant at 37+6 weeks Feeding preference: Breast/formula Weight loss: 3%  Mother requested latch assistance.  When I arrived, "E'mani" was asleep STS on father's chest.  It has only been 2 hours since her last feeding.  Breast feeding basics reviewed.  Suggested mother call for my assistance when baby awakens and cues.  If not cueing by three hours she can call back for assistance.  Mother agreeable.  I will continue further education when mother calls.  RN updated.   Maternal Data Has patient been taught Hand Expression?: Yes Does the patient have breastfeeding experience prior to this delivery?: Yes How long did the patient breastfeed?: 4 months  Feeding Mother's Current Feeding Choice: Breast Milk and Formula  LATCH Score                    Lactation Tools Discussed/Used    Interventions Interventions: Education;Breast feeding basics reviewed  Discharge Pump: WIC Loaner (Mother plans to contact [employer] after the New Year's day holiday)  Consult Status Consult Status: Follow-up Date: 03/24/22 Follow-up type: In-patient    Amahd Morino R Luciano Cinquemani 03/23/2022, 10:21 AM

## 2022-03-23 NOTE — Plan of Care (Signed)

## 2022-03-23 NOTE — Progress Notes (Addendum)
CSW received consult for hx of marijuana use. Referral was screened out due to the following: ~MOB had no documented substance use after initial prenatal visit/+UPT. ~MOB had no positive drug screens after initial prenatal visit/+UPT. (No drug screens ordered during MOB's pnc) ~Baby's UDS is negative.  Per MOB's prenatal care records, MOB's last use of marijuana was May, 2023. MOB states she did not know that she was pregnant at the time and denies marijuana use since confirming pregnancy.   CSW Will Continue to Monitor Umbilical Cord Tissue Drug Screen Results and Make Child Protective Services Report if Warranted.  CSW identifies no further need for intervention and no barriers to discharge at this time.  Signed,  Saranne Crislip K. Lara Palinkas, MSW, LCSWA, LCASA 03/23/2022 10:46 AM 

## 2022-03-23 NOTE — Progress Notes (Signed)
POSTPARTUM POSTOP PROGRESS NOTE  POD #1  Subjective:  No acute events overnight.  Pt denies problems with ambulating, voiding or po intake.  She denies nausea or vomiting.  Pain is well controlled.  She has not had flatus. She has not had bowel movement.  Lochia Minimal.   Objective: Blood pressure (!) 105/50, pulse 60, temperature 98.6 F (37 C), temperature source Oral, resp. rate 16, height 5\' 3"  (1.6 m), weight 126.1 kg, SpO2 99 %, currently breastfeeding.  Physical Exam:  General: alert, cooperative and no distress Lochia:normal flow Chest: CTAB Heart: RRR no m/r/g Abdomen: +BS, soft, nontender Uterine Fundus: firm, 3cm below umbilicus. Honeycomb dressing intact, scant inferior edge drainage Extremities: trace edema, neg calf TTP BL, neg Homans BL  Recent Labs    03/21/22 0907 03/23/22 0422  HGB 12.5 10.9*  HCT 36.3 32.1*    Assessment/Plan:  ASSESSMENT: Tammy Randall is a 26 y.o. 22 s/p [facility]-ERLTCS @ [redacted]w[redacted]d for h/o csx x1 in setting of oligohydramnios and borderline IUGR. PNC c/b short-interval pregnancy, BMI 49, late PNC.   Plan for discharge tomorrow, Breastfeeding, and Lactation consult   LOS: 1 day

## 2022-03-23 NOTE — Lactation Note (Signed)
This note was copied from a baby's chart. Lactation Consultation Note  Patient Name: Tammy Randall ELTRV'U Date: 03/23/2022 Reason for consult: Follow-up assessment Age:25 hours  Lactation followed up and and assessed breastfeeding. I noted rhythmical suckling sequences. I provided additional education regarding latch and positioning.   LATCH Score Latch: Grasps breast easily, tongue down, lips flanged, rhythmical sucking.  Audible Swallowing: A few with stimulation  Type of Nipple: Everted at rest and after stimulation  Comfort (Breast/Nipple): Soft / non-tender  Hold (Positioning): No assistance needed to correctly position infant at breast.  LATCH Score: 9   Lactation Tools Discussed/Used    Interventions Interventions: Breast feeding basics reviewed;Assisted with latch;Skin to skin;Hand express;Education  Discharge    Consult Status Consult Status: Follow-up Date: 03/24/22 Follow-up type: In-patient    Walker Shadow 03/23/2022, 6:34 PM

## 2022-03-23 NOTE — Progress Notes (Signed)
CSW was consulted due to questions about WIC and to inform MOB about hospital drug screen policy. CSW met with MOB at bedside to  inquire about needs and offer support. When CSW entered room, MOB was observed sitting on hospital bed. Infant was asleep on her back in bassinet, FOB was sitting nearby. CSW introduced self and received verbal consent to complete consult with FOB present. CSW explained reason for consult. MOB presented as calm, welcomed CSW, and remained engaged.  CSW inquired about a breast pump while inpatient and post discharge. MOB states she receives [facility]. CSW offered to contact lactation regarding breast pump needs, MOB expressed appreciation. Lactation and RN notified of MOB's request.  CSW explained hospital drug policy due to documented use of marijuana 07/2021. CSW explained that the referral was screened out due to the following: ~MOB had no documented substance use after initial prenatal visit/+UPT. ~MOB had no positive drug screens after initial prenatal visit/+UPT. (No drug screens ordered during MOB's pnc) ~Baby's UDS is negative.  CSW explained that although the referral was screened out, infant's umbilical cord tissue drug screen will continue to be monitored and a child protective services report will be made if warranted. MOB expressed understanding. MOB confirmed the last time she smoked marijuana was 07/2021 and denied using other illicit substances during pregnancy. CSW inquired about prior CPS involvement. MOB states a CPS report was made in 2022 due to her older child Lashonne Shull DOB: 12/11/2020) testing positive for marijuana on a urine drug screen at birth. MOB states CPS visited the home and closed the case shortly after.   CSW inquired about additional resource needs. MOB reports she has a pack n play for infant but may need a car seat prior to discharge. MOB states the car seat she has may be expired. CSW explained that if MOB has exhausted all resources,  the hospital can provide a car seat one time for $30. CSW encouraged MOB to let her care team know if she is in need of a car seat for infant prior to discharge and CSW will assist. MOB expressed appreciation and reports she has all other needed items for infant. MOB denied additional resource needs at this time.   -MOB may need a car seat for infant prior to discharge.  No barriers to infant's discharge at this time.  Signed,  Berniece Salines, MSW, LCSWA, LCASA 03/23/2022 1:35 PM

## 2022-03-24 MED ORDER — IBUPROFEN 600 MG PO TABS: 600.0000 mg | ORAL_TABLET | Freq: Four times a day (QID) | ORAL | 0 refills | Status: DC

## 2022-03-24 MED ORDER — OXYCODONE HCL 5 MG PO TABS: 5.0000 mg | ORAL_TABLET | ORAL | 0 refills | Status: DC | PRN

## 2022-03-24 NOTE — Discharge Instructions (Signed)
As per discharge pamphlet °

## 2022-03-24 NOTE — Discharge Summary (Signed)
Postpartum Discharge Summary      Patient Name: Tammy Randall DOB: [birth year] MRN: [id number]  Date of admission: 03/22/2022 Delivery date:03/22/2022  Delivering provider: Deliah Boston  Date of discharge: 03/24/2022  Admitting diagnosis: History of cesarean section [Z98.891] Intrauterine pregnancy: [redacted]w[redacted]d     Secondary diagnosis:  Principal Problem:   History of cesarean section    Discharge diagnosis: Term Pregnancy Delivered and oligohydramnios, previous c-section                                                Hospital course: Sceduled C/S   27 y.o. yo G2P2002 at [redacted]w[redacted]d was admitted to the hospital 03/22/2022 for scheduled cesarean section with the following indication:Elective Repeat.Delivery details are as follows:  Membrane Rupture Time/Date: 2:19 PM ,03/22/2022   Delivery Method:C-Section, Vacuum Assisted  Details of operation can be found in separate operative note.  Patient had a postpartum course complicated by nothing.  She is ambulating, tolerating a regular diet, passing flatus, and urinating well. Patient is discharged home in stable condition on  03/24/22        Newborn Data: Birth date:03/22/2022  Birth time:2:21 PM  Gender:Female  Living status:Living  Apgars:8 ,9  Weight:2730 g      Physical exam  Vitals:   03/23/22 0962 03/23/22 1400 03/23/22 2042 03/24/22 0433  BP: (!) 105/50 121/71 (!) 105/52 (!) 114/59  Pulse: 60 61 63 61  Resp: 16 18 18 18   Temp: 98.6 F (37 C) 98.2 F (36.8 C) 97.7 F (36.5 C) 98.8 F (37.1 C)  TempSrc: Oral Oral Oral Oral  SpO2:  100% 100% 100%  Weight:      Height:       General: alert Lochia: appropriate Uterine Fundus: firm Incision: Healing well with no significant drainage  Labs: Lab Results  Component Value Date   WBC 12.8 (H) 03/23/2022   HGB 10.9 (L) 03/23/2022   HCT 32.1 (L) 03/23/2022   MCV 85.1 03/23/2022   PLT 185 03/23/2022      Latest Ref Rng & Units 03/23/2022    4:22 AM  CMP   Creatinine 0.44 - 1.00 mg/dL 0.52    Edinburgh Score:    03/23/2022   10:00 AM  Edinburgh Postnatal Depression Scale Screening Tool  I have been able to laugh and see the funny side of things. 0  I have looked forward with enjoyment to things. 0  I have blamed myself unnecessarily when things went wrong. 1  I have been anxious or worried for no good reason. 0  I have felt scared or panicky for no good reason. 0  Things have been getting on top of me. 1  I have been so unhappy that I have had difficulty sleeping. 0  I have felt sad or miserable. 0  I have been so unhappy that I have been crying. 0  The thought of harming myself has occurred to me. 0  Edinburgh Postnatal Depression Scale Total 2      After visit meds:  Allergies as of 03/24/2022   No Known Allergies      Medication List     TAKE these medications    ibuprofen 600 MG tablet Commonly known as: ADVIL Take 1 tablet (600 mg total) by mouth every 6 (six) hours.   oxyCODONE 5 MG immediate release tablet Commonly  known as: Oxy IR/ROXICODONE Take 1 tablet (5 mg total) by mouth every 4 (four) hours as needed for severe pain.   prenatal multivitamin Tabs tablet Take 1 tablet by mouth daily at 12 noon.         Discharge home in stable condition Infant Feeding: Breast Infant Disposition:home with mother Discharge instruction: per After Visit Summary and Postpartum booklet. Activity: Advance as tolerated. Pelvic rest for 6 weeks.  Diet: routine diet Postpartum Appointment:2 weeks Follow up Visit:  Follow-up Information     Shivaji, Melida Quitter, MD. Schedule an appointment as soon as possible for a visit in 2 week(s).   Specialty: Obstetrics and Gynecology Contact information: [address] [place] [address] [phone]                     03/24/2022 Clarene Duke, MD

## 2022-03-24 NOTE — Lactation Note (Signed)
This note was copied from a baby's chart. Lactation Consultation Note  Patient Name: Tammy Randall [id number]'[id number] Date: 03/24/2022 Age: 27 years old  Reason for consult: Follow-up assessment;Early term 37-38.6wks;Infant < 6lbs;Infant weight loss (7 % weight loss, Dyad for D/C today) Baby asleep, mom in dad waiting for D/C home.  LC reviewed the Early term eating plan - Feed with feeding cues, and by 3 hours offer the breast .  Feed at the breast 15 -20 mins , supplement with 30 ml of breast milk or formula. Increase if needed.  Post pump with DEBP both breast for 15 -20 mins , and save milk for the next feeding.  LC reviewed the importance of consistent pumping around the clock around feeding time.  Mom qualified for a DEBP Stork pump and received it prior to D/C.  Maternal Data    Feeding Mother's Current Feeding Choice: Breast Milk and Formula    Lactation Tools Discussed/Used Tools: Pump Breast pump type: Double-Electric Breast Pump Pump Education: Milk Storage;Other (comment) Reason for Pumping: per  mom has pumped x 2 in the last 24 hours hours and still no milk Pumped volume: 0 mL  Interventions    Discharge Discharge Education: Engorgement and breast care;Warning signs for feeding baby Pump: DEBP;Stork Pump (per mom thinks she has a DEBP at home from her 1st baby. Just incase LC called Stork pump rep and was instructed to send a referral for DEBP. [facility] sent.) WIC Program: Yes  Consult Status Consult Status: Complete Date: 03/24/22    Myer Haff 03/24/2022, 11:44 AM

## 2022-03-24 NOTE — Progress Notes (Signed)
POD #2 LTCS Doing well, wants to go home Afeb, VSS Abd- soft, fundus firm, incision intact D/c home 

## 2022-03-25 ENCOUNTER — Encounter (HOSPITAL_COMMUNITY): Payer: Self-pay | Admitting: Obstetrics and Gynecology

## 2022-03-29 ENCOUNTER — Telehealth (HOSPITAL_COMMUNITY): Payer: Self-pay

## 2022-03-29 NOTE — Telephone Encounter (Signed)
Patient reports feeling good. "I had a pain yesterday on the left side of my incision and it has a red spot. I walked around a lot yesterday. Today the pain is feeling better. I called my OB yesterday and they told me to go to [facility] if it got worse. My dressing is still on." Patient describes a honeycomb dressing. RN told patient that typically the honeycomb dressing should be removed day 5 after surgery. RN instructed patient on how to remove dressing. Patient states that that is not what took place with her last pregnancy. RN told patient to call her OB after phone call and discuss her concerns and questions. RN reviewed incision care and signs of infection to report to the OB. Patient declines questions/concerns about her health and healing.  Patient reports that baby is doing well. Eating, peeing/pooping, and gaining weight well. Baby sleeps in a bassinet. RN reviewed ABC's of safe sleep with patient. Patient declines any questions or concerns about baby.  EPDS score is 0.  Sharyn Lull [facility] 03/29/22,1419

## 2022-03-31 ENCOUNTER — Encounter (HOSPITAL_COMMUNITY)
Admission: RE | Admit: 2022-03-31 | Discharge: 2022-03-31 | Disposition: A | Discharge status: Home / Self Care | Payer: Medicaid Other | Source: Ambulatory Visit

## 2022-07-17 ENCOUNTER — Ambulatory Visit: Payer: Medicaid Other | Admitting: Podiatry

## 2022-07-28 ENCOUNTER — Ambulatory Visit: Payer: Medicaid Other | Admitting: Podiatry

## 2022-08-06 ENCOUNTER — Ambulatory Visit (INDEPENDENT_AMBULATORY_CARE_PROVIDER_SITE_OTHER): Payer: Medicaid Other | Admitting: Podiatry

## 2022-08-06 ENCOUNTER — Other Ambulatory Visit: Payer: Self-pay | Admitting: Podiatry

## 2022-08-06 ENCOUNTER — Ambulatory Visit (INDEPENDENT_AMBULATORY_CARE_PROVIDER_SITE_OTHER): Payer: Medicaid Other

## 2022-08-06 DIAGNOSIS — M7751 Other enthesopathy of right foot: Secondary | ICD-10-CM

## 2022-08-06 DIAGNOSIS — M79671 Pain in right foot: Secondary | ICD-10-CM

## 2022-08-06 DIAGNOSIS — M21619 Bunion of unspecified foot: Secondary | ICD-10-CM

## 2022-08-06 DIAGNOSIS — M7752 Other enthesopathy of left foot: Secondary | ICD-10-CM | POA: Diagnosis not present

## 2022-08-06 DIAGNOSIS — M79672 Pain in left foot: Secondary | ICD-10-CM

## 2022-08-06 MED ORDER — TRIAMCINOLONE ACETONIDE 10 MG/ML IJ SUSP
20.0000 mg | Freq: Once | INTRAMUSCULAR | Status: AC
  Administered 2022-08-06: 20 mg

## 2022-08-06 MED ORDER — NAPROXEN 500 MG PO TBEC: 500.0000 mg | DELAYED_RELEASE_TABLET | Freq: Two times a day (BID) | ORAL | 2 refills | Status: AC

## 2022-08-06 NOTE — Progress Notes (Signed)
Subjective:   Patient ID: Tammy Randall, female   DOB: 27 y.o.   MRN: [id number]   HPI Patient states she has had chronic flatfoot deformity bilateral she gets pain in the ankles bilateral and has bunion deformity that becomes painful bilateral.  Patient states she has trouble being active she does have 2 young children and needs to be on her feet even though she does have help.  Patient does not smoke currently   Review of Systems  All other systems reviewed and are negative.       Objective:  Physical Exam Vitals and nursing note reviewed.  Constitutional:      Appearance: She is well-developed.  Pulmonary:     Effort: Pulmonary effort is normal.  Musculoskeletal:        General: Normal range of motion.  Skin:    General: Skin is warm.  Neurological:     Mental Status: She is alert.     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  Patient will be seen back to recheck and hopefully we can get the symptoms under control  X-rays indicate collapse medial longitudinal arch no other pathology noted as far as the arch goes significant bunion deformity bilateral elevation of the intermetatarsal angle of approximate 15 degrees

## 2022-09-03 ENCOUNTER — Ambulatory Visit: Payer: Medicaid Other | Admitting: Podiatry

## 2022-09-26 ENCOUNTER — Other Ambulatory Visit: Payer: Medicaid Other

## 2022-10-30 ENCOUNTER — Ambulatory Visit (INDEPENDENT_AMBULATORY_CARE_PROVIDER_SITE_OTHER): Payer: Medicaid Other | Admitting: Podiatry

## 2022-10-30 DIAGNOSIS — M21619 Bunion of unspecified foot: Secondary | ICD-10-CM

## 2022-10-30 DIAGNOSIS — M7752 Other enthesopathy of left foot: Secondary | ICD-10-CM

## 2022-10-30 DIAGNOSIS — M7751 Other enthesopathy of right foot: Secondary | ICD-10-CM

## 2022-10-30 MED ORDER — NAPROXEN 500 MG PO TABS: 500.0000 mg | ORAL_TABLET | Freq: Two times a day (BID) | ORAL | 2 refills | Status: AC

## 2022-10-30 NOTE — Patient Instructions (Signed)

## 2022-11-02 NOTE — Progress Notes (Signed)
Subjective:   Patient ID: Tammy Randall, female   DOB: 27 y.o.   MRN: [id number]   HPI Patient states the ankles are feeling much better after previous treatment and is developed a lot of discomfort and pain around the first MPJ.  Also presents to pick up orthotics   ROS      Objective:  Physical Exam  Neurovascular status intact patient ankles bilateral are inflamed but improved significantly from previous with orthotics which fit well with inflammation around the first MPJ     Assessment:  Inflammatory condition of the ankles bilateral improved from previous treatment with moderate structural bunion deformity left over right redness discomfort and flatfoot deformity     Plan:  H&P reviewed both conditions.  Orthotics will be broken in which hopefully will help the ankles I discussed the big toe joint and at 1 point surgical intervention with osteotomy will be necessary.  At this point we would utilize shoe gear changes and other treatments and see how the symptoms do and then decide whether or not anything else will be necessary

## 2023-01-29 ENCOUNTER — Ambulatory Visit: Payer: Medicaid Other

## 2023-01-29 NOTE — Progress Notes (Signed)
Patient presents today to pick up custom molded foot orthotics, diagnosed with Bunion  by Dr. Charlsie Merles.   Orthotics were dispensed and fit was satisfactory. Reviewed instructions for break-in and wear. Written instructions given to patient.  Patient will follow up as needed.  Tammy Randall
